# Patient Record
Sex: Male | Born: 1970 | Race: Black or African American | Hispanic: No | Marital: Married | Smoking: Former smoker
Health system: Southern US, Community
[De-identification: ages and names within clinical notes are randomized; demographics above are authoritative.]

## PROBLEM LIST (undated history)

## (undated) DIAGNOSIS — E119 Type 2 diabetes mellitus without complications: Secondary | ICD-10-CM

## (undated) DIAGNOSIS — I10 Essential (primary) hypertension: Secondary | ICD-10-CM

## (undated) HISTORY — PX: RETINAL DETACHMENT SURGERY: SHX105

## (undated) HISTORY — PX: EYE SURGERY: SHX253

---

## 2001-01-22 ENCOUNTER — Ambulatory Visit (HOSPITAL_COMMUNITY): Admission: RE | Admit: 2001-01-22 | Discharge: 2001-01-22 | Payer: Self-pay | Admitting: Internal Medicine

## 2001-01-22 ENCOUNTER — Encounter: Payer: Self-pay | Admitting: Internal Medicine

## 2014-11-02 ENCOUNTER — Encounter (HOSPITAL_COMMUNITY): Payer: Self-pay | Admitting: *Deleted

## 2014-11-02 ENCOUNTER — Emergency Department (HOSPITAL_COMMUNITY)
Admission: EM | Admit: 2014-11-02 | Discharge: 2014-11-02 | Disposition: A | Discharge status: Home / Self Care | Payer: PRIVATE HEALTH INSURANCE | Attending: Emergency Medicine | Admitting: Emergency Medicine

## 2014-11-02 DIAGNOSIS — S01412A Laceration without foreign body of left cheek and temporomandibular area, initial encounter: Secondary | ICD-10-CM | POA: Diagnosis not present

## 2014-11-02 DIAGNOSIS — Y9289 Other specified places as the place of occurrence of the external cause: Secondary | ICD-10-CM | POA: Diagnosis not present

## 2014-11-02 DIAGNOSIS — Z720 Tobacco use: Secondary | ICD-10-CM | POA: Diagnosis not present

## 2014-11-02 DIAGNOSIS — Y998 Other external cause status: Secondary | ICD-10-CM | POA: Diagnosis not present

## 2014-11-02 DIAGNOSIS — Z23 Encounter for immunization: Secondary | ICD-10-CM | POA: Diagnosis not present

## 2014-11-02 DIAGNOSIS — E119 Type 2 diabetes mellitus without complications: Secondary | ICD-10-CM | POA: Diagnosis not present

## 2014-11-02 DIAGNOSIS — Y9389 Activity, other specified: Secondary | ICD-10-CM | POA: Diagnosis not present

## 2014-11-02 DIAGNOSIS — S0181XA Laceration without foreign body of other part of head, initial encounter: Secondary | ICD-10-CM

## 2014-11-02 DIAGNOSIS — Y288XXA Contact with other sharp object, undetermined intent, initial encounter: Secondary | ICD-10-CM | POA: Insufficient documentation

## 2014-11-02 HISTORY — DX: Type 2 diabetes mellitus without complications: E11.9

## 2014-11-02 MED ORDER — TETANUS-DIPHTH-ACELL PERTUSSIS 5-2.5-18.5 LF-MCG/0.5 IM SUSP
0.5000 mL | Freq: Once | INTRAMUSCULAR | Status: AC
  Administered 2014-11-02: 0.5 mL via INTRAMUSCULAR
  Filled 2014-11-02: qty 0.5

## 2014-11-02 MED ORDER — LIDOCAINE HCL (PF) 1 % IJ SOLN
5.0000 mL | Freq: Once | INTRAMUSCULAR | Status: DC
Start: 2014-11-02 — End: 2014-11-03
  Filled 2014-11-02: qty 5

## 2014-11-02 NOTE — ED Provider Notes (Signed)
CSN: [id number]     Arrival date & time 11/02/14  1927 History   First MD Initiated Contact with Patient 11/02/14 2024     Chief Complaint  Patient presents with  . Facial Laceration     (Consider location/radiation/quality/duration/timing/severity/associated sxs/prior Treatment) HPI  George Romero is a 44 y.o. male who presents to the Emergency Department complaining of left facial laceration that occurred just prior to arrival.  States that he tripped and fell on his porch and a nail caused a laceration to his face.  Complains of mild swelling and bleeding.  He denies jaw pain, dental injury, neck pain or LOC.  Last Td is unknown.     Past Medical History  Diagnosis Date  . Diabetes mellitus without complication    History reviewed. No pertinent past surgical history. History reviewed. No pertinent family history. History  Substance Use Topics  . Smoking status: Current Every Day Smoker  . Smokeless tobacco: Not on file  . Alcohol Use: No    Review of Systems  Constitutional: Negative for fever and chills.  Musculoskeletal: Negative for back pain, joint swelling and arthralgias.  Skin: Positive for wound.       Laceration   Neurological: Negative for dizziness, weakness and numbness.  Hematological: Does not bruise/bleed easily.  All other systems reviewed and are negative.     Allergies  Review of patient's allergies indicates no known allergies.  Home Medications   Prior to Admission medications   Not on File   BP 140/84 mmHg  Pulse 99  Temp(Src) 98.9 F (37.2 C) (Oral)  Resp 20  Ht  (1.803 m)  Wt 239 lb (108.41 kg)  BMI 33.35 kg/m2  SpO2 99% Physical Exam  Constitutional: He is oriented to person, place, and time. He appears well-developed and well-nourished. No distress.  HENT:  Head: Normocephalic. Head is with laceration.    Right Ear: Tympanic membrane and ear canal normal.  Left Ear: Tympanic membrane and ear canal normal.  Mouth/Throat:  Uvula is midline, oropharynx is clear and moist and mucous membranes are normal. No trismus in the jaw.  2 cm laceration left face. Mild bleeding.    Neck: Normal range of motion. Neck supple.  Cardiovascular: Normal rate, regular rhythm, normal heart sounds and intact distal pulses.   No murmur heard. Pulmonary/Chest: Effort normal and breath sounds normal. No respiratory distress.  Musculoskeletal: He exhibits no edema or tenderness.  Lymphadenopathy:    He has no cervical adenopathy.  Neurological: He is alert and oriented to person, place, and time. He exhibits normal muscle tone. Coordination normal.  Skin: Skin is warm.  Nursing note and vitals reviewed.   ED Course  Procedures (including critical care time) Labs Review Labs Reviewed - No data to display  Imaging Review No results found.   EKG Interpretation None       LACERATION REPAIR Performed by: Sonu Kruckenberg L. Authorized by: Maxwell Caul Consent: Verbal consent obtained. Risks and benefits: risks, benefits and alternatives were discussed Consent given by: patient Patient identity confirmed: provided demographic data Prepped and Draped in normal sterile fashion Wound explored  Laceration Location: left face Laceration Length: 2 cm  No Foreign Bodies seen or palpated  Anesthesia: local infiltration  Local anesthetic: lidocaine 1% w/o epinephrine  Anesthetic total: 2 ml  Irrigation method: syringe Amount of cleaning: standard  Skin closure: 6-0 prolene  Number of sutures: 4  Technique: simple interrupted  Patient tolerance: Patient tolerated the procedure well with no immediate  complications.  MDM   Final diagnoses:  Facial laceration, initial encounter    Td updated.  Pt agrees to cold compresses, tylenol or ibuprofen if needed for pain.  Wound care instructions given, sutures out in 5-7 days.  Return here if needed.     Davis Ambrosini L. Rowe Robertriplett, PA-C 11/02/14 2112  Raeford RazorStephen Kohut,  MD 11/06/14 [phone]

## 2014-11-02 NOTE — Discharge Instructions (Signed)
Facial Laceration °A facial laceration is a cut on the face. These injuries can be painful and cause bleeding. Some cuts may need to be closed with stitches (sutures), skin adhesive strips, or wound glue. Cuts usually heal quickly but can leave a scar. It can take 1-2 years for the scar to go away completely. °HOME CARE  °· Only take medicines as told by your doctor. °· Follow your doctor's instructions for wound care. °For Stitches: °· Keep the cut clean and dry. °· If you have a bandage (dressing), change it at least once a day. Change the bandage if it gets wet or dirty, or as told by your doctor. °· Wash the cut with soap and water 2 times a day. Rinse the cut with water. Pat it dry with a clean towel. °· Put a thin layer of medicated cream on the cut as told by your doctor. °· You may shower after the first 24 hours. Do not soak the cut in water until the stitches are removed. °· Have your stitches removed as told by your doctor. °· Do not wear any makeup until a few days after your stitches are removed. °For Skin Adhesive Strips: °· Keep the cut clean and dry. °· Do not get the strips wet. You may take a bath, but be careful to keep the cut dry. °· If the cut gets wet, pat it dry with a clean towel. °· The strips will fall off on their own. Do not remove the strips that are still stuck to the cut. °For Wound Glue: °· You may shower or take baths. Do not soak or scrub the cut. Do not swim. Avoid heavy sweating until the glue falls off on its own. After a shower or bath, pat the cut dry with a clean towel. °· Do not put medicine or makeup on your cut until the glue falls off. °· If you have a bandage, do not put tape over the glue. °· Avoid lots of sunlight or tanning lamps until the glue falls off. °· The glue will fall off on its own in 5-10 days. Do not pick at the glue. °After Healing: °Put sunscreen on the cut for the first year to reduce your scar. °GET HELP RIGHT AWAY IF:  °· Your cut area gets red,  painful, or puffy (swollen). °· You see a yellowish-white fluid (pus) coming from the cut. °· You have chills or a fever. °MAKE SURE YOU:  °· Understand these instructions. °· Will watch your condition. °· Will get help right away if you are not doing well or get worse. °Document Released: 03/03/2008 Document Revised: 07/06/2013 Document Reviewed: 04/28/2013 °[employer]® Patient Information ©2015 [employer]. This information is not intended to replace advice given to you by your health care provider. Make sure you discuss any questions you have with your health care provider. ° °

## 2014-11-02 NOTE — ED Notes (Signed)
Lac to rt side of face, fell outside and struck a nail, puncture to lt mandibular area.

## 2014-11-02 NOTE — ED Notes (Signed)
Discharged home in good condition.  Patient verbalized understanding to have sutures removed in 5-7 days.

## 2014-11-11 ENCOUNTER — Emergency Department (HOSPITAL_COMMUNITY)
Admission: EM | Admit: 2014-11-11 | Discharge: 2014-11-11 | Disposition: A | Discharge status: Home / Self Care | Payer: PRIVATE HEALTH INSURANCE | Attending: Emergency Medicine | Admitting: Emergency Medicine

## 2014-11-11 ENCOUNTER — Encounter (HOSPITAL_COMMUNITY): Payer: Self-pay | Admitting: *Deleted

## 2014-11-11 DIAGNOSIS — E119 Type 2 diabetes mellitus without complications: Secondary | ICD-10-CM | POA: Diagnosis not present

## 2014-11-11 DIAGNOSIS — Z720 Tobacco use: Secondary | ICD-10-CM | POA: Diagnosis not present

## 2014-11-11 DIAGNOSIS — Z4802 Encounter for removal of sutures: Secondary | ICD-10-CM | POA: Diagnosis present

## 2014-11-11 NOTE — ED Notes (Addendum)
Sutures placed last Friday to chin area.

## 2014-11-11 NOTE — Discharge Instructions (Signed)

## 2014-11-11 NOTE — ED Provider Notes (Signed)
CSN: [id number]     Arrival date & time 11/11/14  0750 History   First MD Initiated Contact with Patient 11/11/14 0805     Chief Complaint  Patient presents with  . Suture / Staple Removal     (Consider location/radiation/quality/duration/timing/severity/associated sxs/prior Treatment) Patient is a 44 y.o. male presenting with suture removal. The history is provided by the patient.  Suture / Staple Removal  Jackelyn HoehnJames Jergens is a 44 y.o. male who presents to the ED for suture removal. He has 4 sutures to the left side of his face that were placed 9 days ago. He reports no problems.   Past Medical History  Diagnosis Date  . Diabetes mellitus without complication    History reviewed. No pertinent past surgical history. No family history on file. History  Substance Use Topics  . Smoking status: Current Every Day Smoker  . Smokeless tobacco: Not on file  . Alcohol Use: No    Review of Systems Negative except as stated in HPI   Allergies  Review of patient's allergies indicates no known allergies.  Home Medications   Prior to Admission medications   Not on File   BP 131/84 mmHg  Pulse 87  Temp(Src) 98.5 F (36.9 C) (Oral)  Resp 16  SpO2 100% Physical Exam  Constitutional: He is oriented to person, place, and time. He appears well-developed and well-nourished. No distress.  HENT:  Head:    4 sutures in place left side of face. Healing without signs of infection.   Eyes: EOM are normal.  Neck: Neck supple.  Cardiovascular: Normal rate.   Pulmonary/Chest: Effort normal.  Musculoskeletal: Normal range of motion.  Neurological: He is alert and oriented to person, place, and time. No cranial nerve deficit.  Skin: Skin is warm and dry.  Psychiatric: He has a normal mood and affect. His behavior is normal.  Nursing note and vitals reviewed.   ED Course  Procedures  Sutures removed by RN without problems.  MDM  44 y.o. male here for suture removal. Healing wound  without infection.  Final diagnoses:  Visit for suture removal      Janne NapoleonHope M Neese, NP 11/11/14 [id number]  Gerhard Munchobert Lockwood, MD 11/11/14 [phone]

## 2017-12-07 ENCOUNTER — Emergency Department (HOSPITAL_COMMUNITY)
Admission: EM | Admit: 2017-12-07 | Discharge: 2017-12-07 | Disposition: A | Discharge status: Home / Self Care | Payer: PRIVATE HEALTH INSURANCE | Attending: Emergency Medicine | Admitting: Emergency Medicine

## 2017-12-07 ENCOUNTER — Encounter (HOSPITAL_COMMUNITY): Payer: Self-pay

## 2017-12-07 ENCOUNTER — Other Ambulatory Visit: Payer: Self-pay

## 2017-12-07 ENCOUNTER — Emergency Department (HOSPITAL_COMMUNITY): Payer: PRIVATE HEALTH INSURANCE

## 2017-12-07 DIAGNOSIS — Y939 Activity, unspecified: Secondary | ICD-10-CM | POA: Insufficient documentation

## 2017-12-07 DIAGNOSIS — Y990 Civilian activity done for income or pay: Secondary | ICD-10-CM | POA: Insufficient documentation

## 2017-12-07 DIAGNOSIS — Y929 Unspecified place or not applicable: Secondary | ICD-10-CM | POA: Insufficient documentation

## 2017-12-07 DIAGNOSIS — F17210 Nicotine dependence, cigarettes, uncomplicated: Secondary | ICD-10-CM | POA: Insufficient documentation

## 2017-12-07 DIAGNOSIS — W231XXA Caught, crushed, jammed, or pinched between stationary objects, initial encounter: Secondary | ICD-10-CM | POA: Insufficient documentation

## 2017-12-07 DIAGNOSIS — E119 Type 2 diabetes mellitus without complications: Secondary | ICD-10-CM | POA: Insufficient documentation

## 2017-12-07 DIAGNOSIS — S61411A Laceration without foreign body of right hand, initial encounter: Secondary | ICD-10-CM | POA: Insufficient documentation

## 2017-12-07 MED ORDER — HYDROCODONE-ACETAMINOPHEN 5-325 MG PO TABS
2.0000 | ORAL_TABLET | Freq: Once | ORAL | Status: AC
  Administered 2017-12-07: 2 via ORAL
  Filled 2017-12-07: qty 2

## 2017-12-07 MED ORDER — LIDOCAINE HCL (PF) 1 % IJ SOLN: 30.0000 mL | Freq: Once | INTRAMUSCULAR | Status: DC

## 2017-12-07 MED ORDER — OXYCODONE-ACETAMINOPHEN 5-325 MG PO TABS: 1.0000 | ORAL_TABLET | Freq: Three times a day (TID) | ORAL | 0 refills | Status: DC | PRN

## 2017-12-07 MED ORDER — LIDOCAINE HCL (PF) 1 % IJ SOLN
INTRAMUSCULAR | Status: AC
  Filled 2017-12-07: qty 2

## 2017-12-07 MED ORDER — BACITRACIN ZINC 500 UNIT/GM EX OINT
TOPICAL_OINTMENT | CUTANEOUS | Status: AC
  Filled 2017-12-07: qty 0.9

## 2017-12-07 NOTE — ED Provider Notes (Signed)
Emergency Department Provider Note   I have reviewed the triage vital signs and the nursing notes.   HISTORY  Chief Complaint Hand Injury   HPI George Romero is a 47 y.o. male with history of diabetes who presents secondary to hand injury.  Patient is a George Romero and George Romero stand failed and a car fell pinning his hand between the car and the ground.  He was jacked up and his hand was removed and he sustained a laceration across the palm of his right hand initially had a little bit of numbness and difficulty moving his pointer finger which has since resolved.  Pain was controlled with ibuprofen prior to my evaluation while waiting in the emergency department.  Last tetanus shot was about a year ago. No other associated or modifying symptoms.    Past Medical History:  Diagnosis Date  . Diabetes mellitus without complication (HCC)     There are no active problems to display for this patient.   History reviewed. No pertinent surgical history.    Allergies Patient has no known allergies.  History reviewed. No pertinent family history.  Social History Social History   Tobacco Use  . Smoking status: Current Every Day Smoker    Packs/day: 0.50    Types: Cigarettes  . Smokeless tobacco: Never Used  Substance Use Topics  . Alcohol use: No  . Drug use: No    Review of Systems  All other systems negative except as documented in the HPI. All pertinent positives and negatives as reviewed in the HPI. ____________________________________________   PHYSICAL EXAM:  VITAL SIGNS: ED Triage Vitals  Enc Vitals Group     BP 12/07/17 1130 (!) 134/94     Pulse Rate 12/07/17 1130 78     Resp 12/07/17 1130 18     Temp 12/07/17 1130 98.6 F (37 C)     Temp Source 12/07/17 1130 Oral     SpO2 12/07/17 1130 100 %     Weight 12/07/17 1131 240 lb (108.9 kg)     Height 12/07/17 1131 5\' 11"  (1.803 m)     Head Circumference --      Peak Flow --      Pain Score 12/07/17 1131 8   Pain Loc --      Pain Edu? --      Excl. in GC? --     Constitutional: Alert and oriented. Well appearing and in no acute distress. Eyes: Conjunctivae are normal. PERRL. EOMI. Head: Atraumatic. Nose: No congestion/rhinnorhea. Mouth/Throat: Mucous membranes are moist.  Oropharynx non-erythematous. Neck: No stridor.  No meningeal signs.   Cardiovascular: Normal rate, regular rhythm. Good peripheral circulation. Grossly normal heart sounds.   Respiratory: Normal respiratory effort.  No retractions. Lungs CTAB. Gastrointestinal: Soft and nontender. No distention.  Musculoskeletal: No lower extremity tenderness nor edema. No gross deformities of extremities. Neurologic:  Normal speech and language. No gross focal neurologic deficits are appreciated.  Skin:  Skin is warm, dry and laceration over volar surface of mid hand. Full ROM of fingers. Normal sensation to distal fingers. No rash noted.   ____________________________________________    RADIOLOGY  Dg Hand Complete Right  Result Date: 12/07/2017 CLINICAL DATA:  Crush injury by car George Romero. EXAM: RIGHT HAND - COMPLETE 3+ VIEW COMPARISON:  None. FINDINGS: There is no evidence of fracture or dislocation. There is no evidence of arthropathy or other focal bone abnormality. No sign of radiopaque foreign object. IMPRESSION: Negative. Electronically Signed   By: Paulina FusiMark  Shogry  M.D.   On: 12/07/2017 11:59    ____________________________________________   PROCEDURES  Procedure(s) performed:   Marland KitchenMarland KitchenLaceration Repair Date/Time: 12/07/2017 6:01 PM Performed by: Marily Memos, MD Authorized by: Marily Memos, MD   Consent:    Consent obtained:  Verbal   Consent given by:  Patient   Risks discussed:  Infection, pain, poor cosmetic result, poor wound healing, vascular damage, tendon damage and nerve damage   Alternatives discussed:  No treatment Anesthesia (see MAR for exact dosages):    Anesthesia method:  Local infiltration   Local  anesthetic:  Lidocaine 1% w/o epi Laceration details:    Location:  Hand   Hand location:  R palm   Length (cm):  7   Depth (mm):  3 Repair type:    Repair type:  Complex Pre-procedure details:    Preparation:  Patient was prepped and draped in usual sterile fashion Exploration:    Limited defect created (wound extended): no     Hemostasis achieved with:  Direct pressure   Wound exploration: wound explored through full range of motion and entire depth of wound probed and visualized     Wound extent: areolar tissue violated, fascia violated and nerve damage (possibly as he had intermittent numbness to whole pointer finger)     Wound extent: no foreign bodies/material noted, no muscle damage noted, no tendon damage noted, no underlying fracture noted and no vascular damage noted     Contaminated: no   Treatment:    Area cleansed with:  Betadine and saline   Amount of cleaning:  Standard   Irrigation solution:  Sterile saline   Irrigation volume:  500   Irrigation method:  Syringe   Visualized foreign bodies/material removed: no     Debridement:  Moderate   Undermining:  None Skin repair:    Repair method:  Sutures   Suture size:  3-0   Suture material:  Prolene   Suture technique:  Simple interrupted   Number of sutures:  4 Approximation:    Approximation:  Loose Post-procedure details:    Dressing:  Antibiotic ointment, sterile dressing and splint for protection   Patient tolerance of procedure:  Procedure terminated at patient's request Comments:     Patient was not able to be numbed up completely after multiple re-dosing of lidocaine.  Patient asked if there was an alternative and I stated that the wound likely was not going to close perfectly anyway so we decided to put in for sutures loosely and will splint it for the next week until you come back to get the sutures removed and he is okay with having a scar in that area and will come back also for any evidence of  infection.     ____________________________________________   INITIAL IMPRESSION / ASSESSMENT AND PLAN / ED COURSE  TDAP UTD. Laceration will be repaired. Neuro exam normal. Will explore wound fully after lidocaine before repair.   Patient was not able to be numbed up completely after multiple re-dosing of lidocaine.  Patient asked if there was an alternative and I stated that the wound likely was not going to close perfectly anyway so we decided to put in for sutures loosely and will splint it for the next week until he can come back to get the sutures removed and he is okay with having a scar in that area and will come back also for any evidence of infection.  He did have numbness in his right pointer finger after lidocaine was instilled.  This was not present prior to that.  I suspect is related to the lidocaine rather than a nerve injury however it does not improve in 1-2 days he will follow-up with hand surgery.   Pertinent labs & imaging results that were available during my care of the patient were reviewed by me and considered in my medical decision making (see chart for details).  ____________________________________________  FINAL CLINICAL IMPRESSION(S) / ED DIAGNOSES  Final diagnoses:  Laceration of right hand, foreign body presence unspecified, initial encounter     MEDICATIONS GIVEN DURING THIS VISIT:  Medications  lidocaine (PF) (XYLOCAINE) 1 % injection 30 mL (not administered)  lidocaine (PF) (XYLOCAINE) 1 % injection (not administered)  bacitracin 500 UNIT/GM ointment (not administered)  HYDROcodone-acetaminophen (NORCO/VICODIN) 5-325 MG per tablet 2 tablet (2 tablets Oral Given 12/07/17 1612)     NEW OUTPATIENT MEDICATIONS STARTED DURING THIS VISIT:  New Prescriptions   OXYCODONE-ACETAMINOPHEN (PERCOCET) 5-325 MG TABLET    Take 1-2 tablets by mouth every 8 (eight) hours as needed for severe pain.    Note:  This note was prepared with assistance of Dragon  voice recognition software. Occasional wrong-word or sound-a-like substitutions may have occurred due to the inherent limitations of voice recognition software.   Marily Memos, MD 12/07/17 1806

## 2017-12-07 NOTE — ED Notes (Signed)
EDP at bedside  

## 2017-12-07 NOTE — ED Triage Notes (Signed)
PT states a car jack stand slipped and landed onto his left hand. PT c/o decreased feeling and movement to index finger and laceration with bleeding controlled noted to palm of hand.

## 2017-12-17 ENCOUNTER — Other Ambulatory Visit: Payer: Self-pay

## 2017-12-17 ENCOUNTER — Encounter (HOSPITAL_COMMUNITY): Payer: Self-pay | Admitting: Emergency Medicine

## 2017-12-17 ENCOUNTER — Emergency Department (HOSPITAL_COMMUNITY)
Admission: EM | Admit: 2017-12-17 | Discharge: 2017-12-17 | Disposition: A | Discharge status: Home / Self Care | Payer: PRIVATE HEALTH INSURANCE | Attending: Emergency Medicine | Admitting: Emergency Medicine

## 2017-12-17 DIAGNOSIS — Z4802 Encounter for removal of sutures: Secondary | ICD-10-CM | POA: Insufficient documentation

## 2017-12-17 HISTORY — DX: Essential (primary) hypertension: I10

## 2017-12-17 NOTE — ED Provider Notes (Signed)
[facility] EMERGENCY DEPARTMENT Provider Note   CSN: [id number] Arrival date & time: 12/17/17  0708     History   Chief Complaint Chief Complaint  Patient presents with  . Suture / Staple Removal    HPI Julie Paolini is a 47 y.o. male.  Chief complaint is suture removal  HPI 47 year old male.  Lacerated the palm of his right hand when a ball joint on a car fell on his hand 10 days ago.  The wound was x-rayed, explored, and repaired.  He presents for routine suture removal.  He is wearing a dorsal extension blocking splint  Past Medical History:  Diagnosis Date  . Diabetes mellitus without complication (HCC)   . Hypertension     There are no active problems to display for this patient.   History reviewed. No pertinent surgical history.     Home Medications    Prior to Admission medications   Medication Sig Start Date End Date Taking? Authorizing Provider  oxyCODONE-acetaminophen (PERCOCET) 5-325 MG tablet Take 1-2 tablets by mouth every 8 (eight) hours as needed for severe pain. 12/07/17   Mesner, Barbara Cower, MD    Family History History reviewed. No pertinent family history.  Social History Social History   Tobacco Use  . Smoking status: Current Every Day Smoker    Packs/day: 0.50    Types: Cigarettes  . Smokeless tobacco: Never Used  Substance Use Topics  . Alcohol use: No  . Drug use: No     Allergies   Patient has no known allergies.   Review of Systems Review of Systems  Skin:       He has been examining the wound every other day.  It is not draining.  He states it is "still kinda open".     Physical Exam Updated Vital Signs BP (!) 143/100 (BP Location: Left Arm)   Pulse 82   Resp 18   Ht 5\' 11"  (1.803 m)   Wt 112.9 kg (249 lb)   SpO2 100%   BMI 34.73 kg/m   Physical Exam  Exam of the right hand shows an oblique laceration across the palm of the hand and extending into the thenar eminence.  Tendon function distally to FDS, FDP tendon  function is intact.  Sensation is intact.  Capillary refill is intact.  There are 4 sutures approximating this in centimeter wound.  These are removed dehiscence of the wound before and after suture removal.  I discussed with him secondary healing and granulation.  Wound was cleaned.  Covered with Vaseline gauze, dry gauze, and Ace wrap.  The dorsal blocking splint was reapplied at his request.  He states that it keeps him from "bumping it work". ED Treatments / Results  Labs (all labs ordered are listed, but only abnormal results are displayed) Labs Reviewed - No data to display  EKG  EKG Interpretation None       Radiology No results found.  Procedures Procedures (including critical care time)  Medications Ordered in ED Medications - No data to display   Initial Impression / Assessment and Plan / ED Course  I have reviewed the triage vital signs and the nursing notes.  Pertinent labs & imaging results that were available during my care of the patient were reviewed by me and considered in my medical decision making (see chart for details).    Daily removal of gauze dressing for soap and water washing.  Reapplying ointment.  Primary care follow-up.  Final Clinical Impressions(s) /  ED Diagnoses   Final diagnoses:  Visit for suture removal    ED Discharge Orders    None       Rolland PorterJames, Najai Waszak, MD 12/17/17 [phone]

## 2017-12-17 NOTE — Discharge Instructions (Addendum)
Wash wound daily. Re-apply gauze and ace.

## 2017-12-17 NOTE — ED Triage Notes (Signed)
Pt wa seen previously for laceration.   Stitches placed in right hand.  Pt here to have stitches removed.

## 2018-07-02 ENCOUNTER — Encounter: Payer: Self-pay | Admitting: Urology

## 2018-07-02 ENCOUNTER — Ambulatory Visit (INDEPENDENT_AMBULATORY_CARE_PROVIDER_SITE_OTHER): Payer: No Typology Code available for payment source | Admitting: Urology

## 2018-07-02 VITALS — BP 156/94 | HR 73 | Ht 71.0 in | Wt 256.8 lb

## 2018-07-02 DIAGNOSIS — E291 Testicular hypofunction: Secondary | ICD-10-CM

## 2018-07-02 NOTE — Progress Notes (Signed)
07/02/2018 2:20 PM   Jackelyn Hoehn Sep 20, 1971 [id number]  Referring provider: Avon Gully, MD [address] [place], [place] [address]  Chief Complaint  Patient presents with  . Hypogonadism    HPI: 47 year old male seen in consultation at the request of Dr. Felecia Shelling for hypogonadism.  He has a prior history of hypogonadism and states 4-5 years ago he initially started a testosterone gel and was switched to weekly injections when his levels were not adequate.  He took replacement for 2 to 3 years then stopped.  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  He complains of tiredness, fatigue and decreased libido.  He has mild erectile dysfunction.  A testosterone level checked by his PCP on 05/19/2018 was 15 ng/dL.  His PSA was 0.1.   PMH: Past Medical History:  Diagnosis Date  . Diabetes mellitus without complication (HCC)   . Hypertension     Surgical History: No past surgical history on file.  Home Medications:  Allergies as of 07/02/2018   No Known Allergies     Medication List        Accurate as of 07/02/18  2:20 PM. Always use your most recent med list.          LANTUS 100 UNIT/ML injection Generic drug:  insulin glargine INJECT 50 UNITS INTO THE SKIN QHS   lisinopril 10 MG tablet Commonly known as:  PRINIVIL,ZESTRIL TK 1 T PO QD   metFORMIN 1000 MG tablet Commonly known as:  GLUCOPHAGE TK 1 T PO  BID       Allergies: No Known Allergies  Family History: Family History  Problem Relation Age of Onset  . Cancer Mother     Social History:  reports that he has been smoking cigarettes. He has a 7.50 pack-year smoking history. He has never used smokeless tobacco. He reports that he does not drink alcohol or use drugs.  ROS: UROLOGY Frequent Urination?: No Hard to postpone urination?: No Burning/pain with urination?: No Get up at night to urinate?: No Leakage of urine?: No Urine stream starts and stops?:  No Trouble starting stream?: No Do you have to strain to urinate?: No Blood in urine?: No Urinary tract infection?: No Sexually transmitted disease?: No Injury to kidneys or bladder?: No Painful intercourse?: No Weak stream?: No Erection problems?: No Penile pain?: No  Gastrointestinal Nausea?: No Vomiting?: No Indigestion/heartburn?: No Diarrhea?: No Constipation?: No  Constitutional Fever: No Night sweats?: No Weight loss?: No Fatigue?: No  Skin Skin rash/lesions?: No Itching?: No  Eyes Blurred vision?: No Double vision?: No  Ears/Nose/Throat Sore throat?: No Sinus problems?: No  Hematologic/Lymphatic Swollen glands?: No Easy bruising?: No  Cardiovascular Leg swelling?: No Chest pain?: No  Respiratory Cough?: No Shortness of breath?: No  Endocrine Excessive thirst?: No  Musculoskeletal Back pain?: No Joint pain?: No  Neurological Headaches?: No Dizziness?: No  Psychologic Depression?: No Anxiety?: No  Physical Exam: BP (!) 156/94 (BP Location: Left Arm, Patient Position: Sitting, Cuff Size: Large)   Pulse 73   Ht 5\' 11"  (1.803 m)   Wt 256 lb 12.8 oz (116.5 kg)   BMI 35.82 kg/m   Constitutional:  Alert and oriented, No acute distress. HEENT:  AT, moist mucus membranes.  Trachea midline, no masses. Cardiovascular: No clubbing, cyanosis, or edema. Respiratory: Normal respiratory effort, no increased work of breathing. GI: Abdomen is soft, nontender, nondistended, no abdominal masses GU: No CVA tenderness.  Penis is without lesions, left testis surgically  absent.  The right testis is atrophic. Lymph: No cervical or inguinal lymphadenopathy. Skin: No rashes, bruises or suspicious lesions. Neurologic: Grossly intact, no focal deficits, moving all 4 extremities. Psychiatric: Normal mood and affect.   Assessment & Plan:   47 year old male with testosterone level in the castrate range.  Will check an LH and prolactin in addition to a  repeat testosterone.  He previously did not get good levels on topical gels.  Additional options were discussed including injections, Testopel, Natesto and Xyosted.  Potential side effects of testosterone replacement were discussed including stimulation of benign prostatic growth with lower urinary tract symptoms; erythrocytosis; edema; gynecomastia; worsening sleep apnea; venous thromboembolism; testicular atrophy and infertility. Recent studies suggesting an increased incidence of heart attack and stroke in patients taking testosterone was discussed. He was informed there is conflicting evidence regarding the impact of testosterone therapy on cardiovascular risk. The theoretical risk of growth stimulation of an undetected prostate cancer was also discussed.  He was informed that current evidence does not provide any definitive answers regarding the risks of testosterone therapy on prostate cancer and cardiovascular disease. The need for periodic monitoring of his testosterone level, PSA, hematocrit and DRE was discussed.  He will be contacted with his lab work and will think over his preferred method of delivery.   Riki Altes, MD  [facility] [address] [place], [place] [address] [phone]

## 2018-07-03 LAB — TESTOSTERONE: TESTOSTERONE: 13 ng/dL — AB (ref 264–916)

## 2018-07-03 LAB — LUTEINIZING HORMONE: LH: 8.4 m[IU]/mL (ref 1.7–8.6)

## 2018-07-03 LAB — PROLACTIN: PROLACTIN: 3.4 ng/mL — AB (ref 4.0–15.2)

## 2018-07-03 LAB — PSA: Prostate Specific Ag, Serum: <0.1 ng/mL (ref 0.0–4.0)

## 2018-07-11 ENCOUNTER — Other Ambulatory Visit: Payer: Self-pay | Admitting: Urology

## 2018-07-11 DIAGNOSIS — E291 Testicular hypofunction: Secondary | ICD-10-CM

## 2018-07-13 ENCOUNTER — Telehealth: Payer: Self-pay | Admitting: Family Medicine

## 2018-07-13 NOTE — Telephone Encounter (Signed)
-----   Message from Riki Altes, MD sent at 07/11/2018 10:18 AM EDT ----- Testosterone level extremely low at 13 and his LH is not appropriately elevated.  Recommend a pituitary MRI.  Order was entered.  Will call with results.

## 2018-07-13 NOTE — Telephone Encounter (Signed)
Patient notified and voiced understanding.

## 2018-08-03 ENCOUNTER — Encounter (HOSPITAL_COMMUNITY): Payer: Self-pay

## 2018-08-03 ENCOUNTER — Ambulatory Visit (HOSPITAL_COMMUNITY)
Admission: RE | Admit: 2018-08-03 | Discharge: 2018-08-03 | Disposition: A | Discharge status: Home / Self Care | Payer: No Typology Code available for payment source | Source: Ambulatory Visit | Attending: Urology | Admitting: Urology

## 2018-08-03 DIAGNOSIS — E291 Testicular hypofunction: Secondary | ICD-10-CM

## 2018-08-16 ENCOUNTER — Telehealth: Payer: Self-pay | Admitting: Urology

## 2018-08-16 NOTE — Telephone Encounter (Signed)
-----   Message from Riki AltesScott C Stoioff, MD sent at 08/13/2018  7:20 AM EST ----- I saw this patient 1 month ago and recommended a pituitary MRI.  [place] lives in [place] and wanted it scheduled there.  It does not look like it is yet been scheduled.  Can you please check on status?

## 2018-08-16 NOTE — Telephone Encounter (Signed)
I called the patient and had to leave a voicemail to call back to find out why he never got his MRI. His PA expires on the 25th so if he is going to get it ne needs to do it before then.    George DusterMichelle

## 2018-08-17 NOTE — Telephone Encounter (Signed)
He definitely needs to have the MRI done because he may have a pituitary tumor.  Can see if an open MRI can be scheduled.  The [facility].

## 2018-09-09 NOTE — Telephone Encounter (Signed)
He still doesn't want to do it and said [facility] is to far away. He is coming in on Tuesday to talk to you.   George DusterMichelle

## 2018-09-14 ENCOUNTER — Encounter: Payer: Self-pay | Admitting: Urology

## 2018-09-14 ENCOUNTER — Ambulatory Visit (INDEPENDENT_AMBULATORY_CARE_PROVIDER_SITE_OTHER): Payer: No Typology Code available for payment source | Admitting: Urology

## 2018-09-14 VITALS — BP 150/97 | HR 80 | Ht 71.0 in | Wt 260.0 lb

## 2018-09-14 DIAGNOSIS — E291 Testicular hypofunction: Secondary | ICD-10-CM

## 2018-09-14 NOTE — Progress Notes (Signed)
09/14/2018 3:15 PM   Jackelyn HoehnJames Solivan 04-27-1971 [id number]  Referring provider: Avon GullyFanta, Tesfaye, MD [address] [place], [place] [id number]  Chief Complaint  Patient presents with  . Hypogonadism    discuss options    HPI: 47 year old male presents for follow-up of hypogonadism.  He had a castrate testosterone level and an LH that was not appropriately elevated.  I had recommended a pituitary MRI and he declined to have it done.  He made a follow-up appointment today.  No previous records are available.   PMH: Past Medical History:  Diagnosis Date  . Diabetes mellitus without complication (HCC)   . Hypertension     Surgical History: No past surgical history on file.  Home Medications:  Allergies as of 09/14/2018   No Known Allergies     Medication List       Accurate as of September 14, 2018  3:15 PM. Always use your most recent med list.        LANTUS 100 UNIT/ML injection Generic drug:  insulin glargine INJECT 50 UNITS INTO THE SKIN QHS   lisinopril 10 MG tablet Commonly known as:  PRINIVIL,ZESTRIL TK 1 T PO QD   metFORMIN 1000 MG tablet Commonly known as:  GLUCOPHAGE TK 1 T PO  BID       Allergies: No Known Allergies  Family History: Family History  Problem Relation Age of Onset  . Cancer Mother     Social History:  reports that he has been smoking cigarettes. He has a 7.50 pack-year smoking history. He has never used smokeless tobacco. He reports that he does not drink alcohol or use drugs.  ROS: UROLOGY Frequent Urination?: No Hard to postpone urination?: No Burning/pain with urination?: No Get up at night to urinate?: No Leakage of urine?: No Urine stream starts and stops?: No Trouble starting stream?: No Do you have to strain to urinate?: No Blood in urine?: No Urinary tract infection?: No Sexually transmitted disease?: No Injury to kidneys or bladder?: No Painful intercourse?: No Weak stream?: No Erection problems?:  No Penile pain?: No  Gastrointestinal Nausea?: No Vomiting?: No Indigestion/heartburn?: No Diarrhea?: No Constipation?: No  Constitutional Fever: No Night sweats?: No Weight loss?: No Fatigue?: No  Skin Skin rash/lesions?: No Itching?: No  Eyes Blurred vision?: No Double vision?: No  Ears/Nose/Throat Sore throat?: No Sinus problems?: No  Hematologic/Lymphatic Swollen glands?: No Easy bruising?: No  Cardiovascular Leg swelling?: No Chest pain?: No  Respiratory Cough?: No Shortness of breath?: No  Endocrine Excessive thirst?: No  Musculoskeletal Back pain?: No Joint pain?: No  Neurological Headaches?: No Dizziness?: No  Psychologic Depression?: No Anxiety?: No  Physical Exam: BP (!) 150/97 (BP Location: Left Arm, Patient Position: Sitting, Cuff Size: Large)   Pulse 80   Ht 5\' 11"  (1.803 m)   Wt 260 lb (117.9 kg)   BMI 36.26 kg/m   Constitutional:  Alert and oriented, No acute distress. HEENT: [place] AT, moist mucus membranes.  Trachea midline, no masses. Cardiovascular: No clubbing, cyanosis, or edema. Respiratory: Normal respiratory effort, no increased work of breathing. GI: Abdomen is soft, nontender, nondistended, no abdominal masses GU: No CVA tenderness Lymph: No cervical or inguinal lymphadenopathy. Skin: No rashes, bruises or suspicious lesions. Neurologic: Grossly intact, no focal deficits, moving all 4 extremities. Psychiatric: Normal mood and affect.   Assessment & Plan:   47 year old male with castrate testosterone level and nml LH.  Discussed possibility of a pituitary tumor.  He has declined MRI.  I  informed him I did not feel comfortable treating his hypogonadism without pituitary imaging.  I offered him an endocrinology appointment for second opinion which he has elected to pursue.   Riki Altes, MD  [facility] [address], [address] [place], [place] [address] [phone]

## 2018-09-15 ENCOUNTER — Encounter: Payer: Self-pay | Admitting: Urology

## 2018-11-15 ENCOUNTER — Encounter: Payer: Self-pay | Admitting: "Endocrinology

## 2018-11-15 ENCOUNTER — Ambulatory Visit (INDEPENDENT_AMBULATORY_CARE_PROVIDER_SITE_OTHER): Payer: No Typology Code available for payment source | Admitting: "Endocrinology

## 2018-11-15 VITALS — BP 138/84 | HR 74 | Ht 71.0 in | Wt 268.0 lb

## 2018-11-15 DIAGNOSIS — E291 Testicular hypofunction: Secondary | ICD-10-CM | POA: Diagnosis not present

## 2018-11-15 NOTE — Progress Notes (Signed)
Endocrinology Consult Note   George Romero, 48 y.o., male   Chief Complaint  Patient presents with  . Establish Care    hypogonadism     Past Medical History:  Diagnosis Date  . Diabetes mellitus without complication (HCC)   . Hypertension    History reviewed. No pertinent surgical history. Social History   Socioeconomic History  . Marital status: Married    Spouse name: Not on file  . Number of children: Not on file  . Years of education: Not on file  . Highest education level: Not on file  Occupational History  . Not on file  Social Needs  . Financial resource strain: Not on file  . Food insecurity:    Worry: Not on file    Inability: Not on file  . Transportation needs:    Medical: Not on file    Non-medical: Not on file  Tobacco Use  . Smoking status: Current Every Day Smoker    Packs/day: 0.50    Years: 15.00    Pack years: 7.50    Types: Cigarettes  . Smokeless tobacco: Never Used  Substance and Sexual Activity  . Alcohol use: No  . Drug use: No  . Sexual activity: Yes    Birth control/protection: None  Lifestyle  . Physical activity:    Days per week: Not on file    Minutes per session: Not on file  . Stress: Not on file  Relationships  . Social connections:    Talks on phone: Not on file    Gets together: Not on file    Attends religious service: Not on file    Active member of club or organization: Not on file    Attends meetings of clubs or organizations: Not on file    Relationship status: Not on file  Other Topics Concern  . Not on file  Social History Narrative  . Not on file   Outpatient Encounter Medications as of 11/15/2018  Medication Sig  . LANTUS 100 UNIT/ML injection INJECT 50 UNITS INTO THE SKIN QHS  . lisinopril (PRINIVIL,ZESTRIL) 10 MG tablet TK 1 T PO QD  . metFORMIN (GLUCOPHAGE) 1000 MG tablet TK 1 T PO  BID   No facility-administered encounter medications on file as of  11/15/2018.    ALLERGIES: No Known Allergies  VACCINATION STATUS: Immunization History  Administered Date(s) Administered  . Tdap 11/02/2014    HPI: George Romero is a 48 y.o.-year-old man, being seen in consultation for evaluation and management of low testosterone requested by by his  Avon Gully, MD.  -Patient with long and complicated medical history.  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 -He was previously treated with various forms of testosterone replacement including topical and injectable forms.  His last exposure to testosterone was more than 4 years ago with Dr. Johny Chess. -He admits to decreased libido, has difficulty obtaining and maintaining erection.  -He denies trauma to the testicles, chemotherapy, and radiation.  He grew and went through puberty like his peers, has never achieved fertility. -He wishes to be treated with testosterone again.  No vision problems.  No report of changing headaches.  No family history of hypogonadism/infertility.  No family history of hemochromatosis or pituitary tumors.  ROS: Constitutional: + Perceived weight gain,  +  fatigue, no subjective hyperthermia/hypothermia Eyes:  no blurry vision, no xerophthalmia ENT: no sore throat, no nodules palpated in throat, no dysphagia/odynophagia, no hoarseness Cardiovascular: no CP/SOB/palpitations/leg swelling Respiratory: no cough, no shortness of breath. Gastrointestinal: no nausea, vomiting, diarrhea, nor constipation Musculoskeletal: no muscle/joint aches Skin: no rashes Neurological: no tremors/numbness/tingling/dizziness Psychiatric: no depression/anxiety  PE: BP 138/84   Pulse 74   Ht 5\' 11"  (1.803 m)   Wt 268 lb (121.6 kg)   BMI 37.38 kg/m  Wt Readings from Last 3 Encounters:  11/15/18 268 lb (121.6 kg)  09/14/18 260 lb (117.9  kg)  07/02/18 256 lb 12.8 oz (116.5 kg)   Constitutional:  + obese, in NAD Eyes: PERRLA, EOMI, no exophthalmos ENT: moist mucous membranes, no thyromegaly, no cervical lymphadenopathy Cardiovascular: RRR, No MRG Respiratory: CTA B Gastrointestinal: abdomen soft, NT, ND, BS+ Musculoskeletal: no deformities, strength intact in all 4 Skin: moist, warm, no rashes Neurological: no tremor with outstretched hands, DTR normal in all 4 Genital exam: + Evidence of previous inguinal surgery, right testicle soft and less than 10 cc, 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  No other intrascrotal mass lesions.  No inguinal lymphadenopathy, normal phallus, no penile discharge.  + gynecomastia.  Results for George Romero, George Romero (MRN [id number]) as of 11/15/2018 10:36  Ref. Range 07/02/2018 14:14  LH Latest Ref Range: 1.7 - 8.6 mIU/mL 8.4  Prolactin Latest Ref Range: 4.0 - 15.2 ng/mL 3.4 (L)  Testosterone Latest Ref Range: 264 - 916 ng/dL 13 (L)  Prostate Specific Ag, Serum Latest Ref Range: 0.0 - 4.0 ng/mL <0.1    ASSESSMENT: 1. 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  Surgical treatment was attempted when he was 40 or 48 years old.  He only has soft and shrunk right testicle-less than 10 cc. - I have discussed potential causes of hypogonadism, diagnosis of hypogonadism,  and relevant workup to confirm the diagnosis of hypogonadism before initiating testosterone replacement therapy.  -Patient is unlikely to achieve any fertility, and is aware of the fact that testosterone replacement will not help him regain fertility.  -This patient will require and benefit from testosterone treatment. -I will proceed to repeat his labs this morning on a fasting sample,  and patient returns in 1 week for treatment initiation. -I had a long discussion with him about the need for regular follow-up with lab work at least every 3-53-month in order to achieve optimal testosterone replacement therapy.  -Given his history of type 2 diabetes, he will benefit the most from weight loss.  We briefly discussed about controlling his diet, improving his sleep, and exercise regularly.   -He may benefit from phosphodiesterase inhibitors for erectile dysfunction.  -No prescription was initiated today.   - Time spent with the patient: 35 minutes, of which >50% was spent in obtaining information about his symptoms, reviewing his previous labs, evaluations, and treatments, counseling him about his chronic primary hypogonadism, and developing a plan to confirm the diagnosis and long term treatment as necessary.  George Romero participated in the discussions, expressed understanding, and voiced agreement with the above plans.  All questions were answered to his satisfaction. he is encouraged to contact clinic should he have any questions or concerns prior to his return visit.  Return in about 1 week (around  11/22/2018) for Labs Today- Non-Fasting Ok.  Marquis LunchGebre Zissy Hamlett, MD [facility] [address] [place], [place] [address] Phone: [phone]  Fax: [phone]   11/15/2018, 10:31 AM  This note was partially dictated with voice recognition software. Similar sounding words can be transcribed inadequately or may not  be corrected upon review.

## 2018-11-18 LAB — TESTOS,TOTAL,FREE AND SHBG (FEMALE)
FREE TESTOSTERONE: 1.8 pg/mL — AB (ref 35.0–155.0)
SEX HORMONE BINDING: 38 nmol/L (ref 10–50)
TESTOSTERONE, TOTAL, LC-MS-MS: 14 ng/dL — AB (ref 250–1100)

## 2018-11-18 LAB — CBC WITH DIFFERENTIAL/PLATELET
Absolute Monocytes: 382 cells/uL (ref 200–950)
BASOS ABS: 109 {cells}/uL (ref 0–200)
Basophils Relative: 1.4 %
EOS PCT: 4.1 %
Eosinophils Absolute: 320 cells/uL (ref 15–500)
HCT: 38.3 % — ABNORMAL LOW (ref 38.5–50.0)
Hemoglobin: 12.6 g/dL — ABNORMAL LOW (ref 13.2–17.1)
Lymphs Abs: 2824 cells/uL (ref 850–3900)
MCH: 27.3 pg (ref 27.0–33.0)
MCHC: 32.9 g/dL (ref 32.0–36.0)
MCV: 83.1 fL (ref 80.0–100.0)
MPV: 11 fL (ref 7.5–12.5)
Monocytes Relative: 4.9 %
NEUTROS PCT: 53.4 %
Neutro Abs: 4165 cells/uL (ref 1500–7800)
PLATELETS: 308 10*3/uL (ref 140–400)
RBC: 4.61 10*6/uL (ref 4.20–5.80)
RDW: 12.7 % (ref 11.0–15.0)
TOTAL LYMPHOCYTE: 36.2 %
WBC: 7.8 10*3/uL (ref 3.8–10.8)

## 2018-11-23 ENCOUNTER — Encounter: Payer: Self-pay | Admitting: "Endocrinology

## 2018-11-23 ENCOUNTER — Ambulatory Visit (INDEPENDENT_AMBULATORY_CARE_PROVIDER_SITE_OTHER): Payer: No Typology Code available for payment source | Admitting: "Endocrinology

## 2018-11-23 VITALS — BP 130/80 | HR 83 | Ht 71.0 in | Wt 265.0 lb

## 2018-11-23 DIAGNOSIS — E1165 Type 2 diabetes mellitus with hyperglycemia: Secondary | ICD-10-CM | POA: Diagnosis not present

## 2018-11-23 DIAGNOSIS — E291 Testicular hypofunction: Secondary | ICD-10-CM | POA: Diagnosis not present

## 2018-11-23 MED ORDER — "SYRINGE/NEEDLE (DISP) 21G X 1-1/2"" 3 ML MISC": 0 refills | Status: AC

## 2018-11-23 MED ORDER — TESTOSTERONE CYPIONATE 200 MG/ML IM SOLN: 100.0000 mg | INTRAMUSCULAR | 0 refills | Status: DC

## 2018-11-23 NOTE — Progress Notes (Signed)
Endocrinology follow-up  Note   George Romero, 48 y.o., male   Chief Complaint  Patient presents with  . Follow-up    Hypogonadism     Past Medical History:  Diagnosis Date  . Diabetes mellitus without complication (HCC)   . Hypertension    History reviewed. No pertinent surgical history. Social History   Socioeconomic History  . Marital status: Married    Spouse name: Not on file  . Number of children: Not on file  . Years of education: Not on file  . Highest education level: Not on file  Occupational History  . Not on file  Social Needs  . Financial resource strain: Not on file  . Food insecurity:    Worry: Not on file    Inability: Not on file  . Transportation needs:    Medical: Not on file    Non-medical: Not on file  Tobacco Use  . Smoking status: Current Every Day Smoker    Packs/day: 0.50    Years: 15.00    Pack years: 7.50    Types: Cigarettes  . Smokeless tobacco: Never Used  Substance and Sexual Activity  . Alcohol use: No  . Drug use: No  . Sexual activity: Yes    Birth control/protection: None  Lifestyle  . Physical activity:    Days per week: Not on file    Minutes per session: Not on file  . Stress: Not on file  Relationships  . Social connections:    Talks on phone: Not on file    Gets together: Not on file    Attends religious service: Not on file    Active member of club or organization: Not on file    Attends meetings of clubs or organizations: Not on file    Relationship status: Not on file  Other Topics Concern  . Not on file  Social History Narrative  . Not on file   Outpatient Encounter Medications as of 11/23/2018  Medication Sig  . LANTUS 100 UNIT/ML injection Inject 40 Units into the skin at bedtime.  Marland Kitchen lisinopril (PRINIVIL,ZESTRIL) 10 MG tablet TK 1 T PO QD  . metFORMIN (GLUCOPHAGE) 1000 MG tablet TK 1 T PO  BID  . SYRINGE-NEEDLE, DISP, 3 ML 21G X 1-1/2" 3 ML MISC Use to  inject testosterone every week  . testosterone cypionate (DEPOTESTOSTERONE CYPIONATE) 200 MG/ML injection Inject 0.5 mLs (100 mg total) into the muscle every 7 (seven) days.   No facility-administered encounter medications on file as of 11/23/2018.    ALLERGIES: No Known Allergies  VACCINATION STATUS: Immunization History  Administered Date(s) Administered  . Tdap 11/02/2014    HPI: George Romero is a 48 y.o.-year-old man.  He is returning with repeat labs after he was seen in consultation for evaluation and management of low testosterone requested by by his  PMD  Avon Gully, MD.  -Patient with long and complicated medical history.  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 -He was previously treated with various forms of testosterone replacement including topical and injectable forms.  His last exposure to testosterone was more than 4 years ago with Dr. Johny Chess. -He admits to decreased libido, has difficulty obtaining and maintaining erection.  -He denies hx of  chemotherapy, and irradiation.  -He has never achieved  fertility. -He wishes to be treated with testosterone again.  No vision problems.  No report of changing headaches.  No family history of hypogonadism/infertility.  No family history of hemochromatosis or pituitary tumors.  ROS: Constitutional: + Progressive  Weight gain,  +  fatigue, no subjective hyperthermia/hypothermia Eyes: no blurry vision, no xerophthalmia ENT: no sore throat, no nodules palpated in throat, no dysphagia/odynophagia, no hoarseness Cardiovascular: no CP/SOB/palpitations/leg swelling Respiratory: no cough, no shortness of breath. Gastrointestinal: no nausea, vomiting, diarrhea, nor constipation Musculoskeletal: no muscle/joint aches Skin: no rashes Neurological: no  tremors/numbness/tingling/dizziness Psychiatric: no depression/anxiety  PE: BP 130/80   Pulse 83   Ht 5\' 11"  (1.803 m)   Wt 265 lb (120.2 kg)   BMI 36.96 kg/m  Wt Readings from Last 3 Encounters:  11/23/18 265 lb (120.2 kg)  11/15/18 268 lb (121.6 kg)  09/14/18 260 lb (117.9 kg)   Constitutional:  + obese, in NAD Eyes: PERRLA, EOMI, no exophthalmos ENT: moist mucous membranes, no thyromegaly, no cervical lymphadenopathy Cardiovascular: RRR, No MRG Respiratory: CTA B Gastrointestinal: abdomen soft, NT, ND, BS+ Musculoskeletal: no deformities, strength intact in all 4 Skin: moist, warm, no rashes Neurological: no tremor with outstretched hands, DTR normal in all 4 Genital exam: + Evidence of previous inguinal surgery, right testicle soft and less than 10 cc, 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  No other intrascrotal mass lesions.  No inguinal lymphadenopathy, normal phallus, no penile discharge.  + gynecomastia.  Results for George, Romero (MRN [id number]) as of 11/15/2018 10:36  Ref. Range 07/02/2018 14:14  LH Latest Ref Range: 1.7 - 8.6 mIU/mL 8.4  Prolactin Latest Ref Range: 4.0 - 15.2 ng/mL 3.4 (L)  Testosterone Latest Ref Range: 264 - 916 ng/dL 13 (L)  Prostate Specific Ag, Serum Latest Ref Range: 0.0 - 4.0 ng/mL <0.1    Recent Results (from the past 2160 hour(s))  CBC with Differential/Platelet     Status: Abnormal   Collection Time: 11/15/18 10:18 AM  Result Value Ref Range   WBC 7.8 3.8 - 10.8 Thousand/uL   RBC 4.61 4.20 - 5.80 Million/uL   Hemoglobin 12.6 (L) 13.2 - 17.1 g/dL   HCT 70.1 (L) 41.0 - 30.1 %   MCV 83.1 80.0 - 100.0 fL   MCH 27.3 27.0 - 33.0 pg   MCHC 32.9 32.0 - 36.0 g/dL   RDW 31.4 38.8 - 87.5 %   Platelets 308 140 - 400 Thousand/uL   MPV 11.0 7.5 - 12.5 fL   Neutro Abs 4,165 1,500 - 7,800 cells/uL   Lymphs Abs 2,824 850 - 3,900 cells/uL   Absolute Monocytes 382 200 - 950 cells/uL   Eosinophils Absolute 320 15 - 500 cells/uL   Basophils Absolute  109 0 - 200 cells/uL   Neutrophils Relative % 53.4 %   Total Lymphocyte 36.2 %   Monocytes Relative 4.9 %   Eosinophils Relative 4.1 %   Basophils Relative 1.4 %  Testos,Total,Free and SHBG (Male)     Status: Abnormal   Collection Time: 11/15/18 10:18 AM  Result Value Ref Range   Testosterone, Total, LC-MS-MS 14 (L) 250 - 1,100 ng/dL    Comment: . For additional information, please refer to http://education.questdiagnostics.com/faq/ TotalTestosteroneLCMSMSFAQ165 (This link is being provided for informational/ educational purposes only.) . This test was developed and its analytical performance characteristics have been determined by [facility] [place], [place]. It has not been cleared or approved by the U.S. Food and Drug Administration. This assay has been validated  pursuant to the CLIA regulations and is used for clinical purposes. .    Free Testosterone 1.8 (L) 35.0 - 155.0 pg/mL    Comment: . This test was developed and its analytical performance characteristics have been determined by [facility] [place], [place]. It has not been cleared or approved by the U.S. Food and Drug Administration. This assay has been validated pursuant to the CLIA regulations and is used for clinical purposes. .    Sex Hormone Binding 38 10 - 50 nmol/L     ASSESSMENT: 1. 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  Surgical treatment likely orchidopexy was attempted when he was 75 or 48 years old.  He only has soft and shrunk right testicle-less than 10 cc. -This patient will require and benefit from testosterone treatment. - I have discussed safe testosterone replacement strategies.  -I discussed and prescribed testosterone cypionate 100 mg IM every 7 days with plan to repeat total testosterone in 24-month.  -Patient is  unlikely to achieve any fertility, and is aware of the fact that testosterone replacement will not help him regain fertility.  -I had a long discussion with him about the need for regular follow-up with lab work at least every 3-55-month in order to achieve optimal testosterone replacement therapy.  -Given his history of type 2 diabetes, he will benefit the most from weight loss.  We briefly discussed about controlling his diet, improving his sleep, and exercise regularly.    -He is advised to continue Lantus 40 units nightly, metformin 1000 mg p.o. twice daily.  He is also advised to continue close monitoring twice a day-daily before breakfast and at bedtime.  - Patient admits there is a room for improvement in his diet and drink choices. -  Suggestion is made for him to avoid simple carbohydrates  from his diet including Cakes, Sweet Desserts / Pastries, Ice Cream, Soda (diet and regular), Sweet Tea, Candies, Chips, Cookies, Store Bought Juices, Alcohol in Excess of  1-2 drinks a day, Artificial Sweeteners, and "Sugar-free" Products. This will help patient to have stable blood glucose profile and potentially avoid unintended weight gain. -Consult with a dietitian is initiated.   - Time spent with the patient: 25 min, of which >50% was spent in reviewing his  current and  previous labs/studies, previous treatments, and medications doses and developing a plan for long-term care based on the latest recommendations for standards of care. George Romero participated in the discussions, expressed understanding, and voiced agreement with the above plans.  All questions were answered to his satisfaction. he is encouraged to contact clinic should he have any questions or concerns prior to his return visit.   Return in about 3 months (around 02/21/2019) for Follow up with Pre-visit Labs, Meter, and Logs.  Marquis Lunch, MD [facility] [address] [place], [place] [address] Phone: [phone]  Fax: [phone]   11/23/2018, 10:03 AM  This note was partially dictated with voice recognition software. Similar sounding words can be transcribed inadequately or may not  be corrected upon review.

## 2018-11-23 NOTE — Patient Instructions (Signed)
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
## 2019-02-23 ENCOUNTER — Ambulatory Visit: Payer: No Typology Code available for payment source | Admitting: "Endocrinology

## 2019-02-23 ENCOUNTER — Other Ambulatory Visit: Payer: Self-pay
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
## 2020-09-11 ENCOUNTER — Ambulatory Visit (INDEPENDENT_AMBULATORY_CARE_PROVIDER_SITE_OTHER): Payer: Self-pay | Admitting: Ophthalmology

## 2020-09-11 ENCOUNTER — Encounter (INDEPENDENT_AMBULATORY_CARE_PROVIDER_SITE_OTHER): Payer: Self-pay | Admitting: Ophthalmology

## 2020-09-11 ENCOUNTER — Other Ambulatory Visit: Payer: Self-pay

## 2020-09-11 DIAGNOSIS — H3581 Retinal edema: Secondary | ICD-10-CM

## 2020-09-11 DIAGNOSIS — H35033 Hypertensive retinopathy, bilateral: Secondary | ICD-10-CM

## 2020-09-11 DIAGNOSIS — I10 Essential (primary) hypertension: Secondary | ICD-10-CM

## 2020-09-11 DIAGNOSIS — H269 Unspecified cataract: Secondary | ICD-10-CM

## 2020-09-11 DIAGNOSIS — H40053 Ocular hypertension, bilateral: Secondary | ICD-10-CM

## 2020-09-11 DIAGNOSIS — E113513 Type 2 diabetes mellitus with proliferative diabetic retinopathy with macular edema, bilateral: Secondary | ICD-10-CM

## 2021-02-28 ENCOUNTER — Encounter (INDEPENDENT_AMBULATORY_CARE_PROVIDER_SITE_OTHER): Payer: Self-pay

## 2021-02-28 ENCOUNTER — Encounter (INDEPENDENT_AMBULATORY_CARE_PROVIDER_SITE_OTHER): Payer: No Typology Code available for payment source | Admitting: Ophthalmology

## 2021-06-11 LAB — TESTOSTERONE: Testosterone: 22

## 2021-06-11 LAB — PSA: PSA: 0.1

## 2021-06-26 ENCOUNTER — Encounter: Payer: Self-pay | Admitting: "Endocrinology

## 2021-06-26 ENCOUNTER — Ambulatory Visit (INDEPENDENT_AMBULATORY_CARE_PROVIDER_SITE_OTHER): Payer: No Typology Code available for payment source | Admitting: "Endocrinology

## 2021-06-26 VITALS — BP 126/78 | HR 68 | Ht 71.0 in | Wt 259.6 lb

## 2021-06-26 DIAGNOSIS — E291 Testicular hypofunction: Secondary | ICD-10-CM

## 2021-06-26 MED ORDER — TESTOSTERONE 20.25 MG/ACT (1.62%) TD GEL: TRANSDERMAL | 0 refills | Status: DC

## 2021-06-26 NOTE — Progress Notes (Signed)
Endocrinology follow-up  Note   George Romero, 50 y.o., male   Chief Complaint  Patient presents with   Follow-up    Hypogonadism, male     Past Medical History:  Diagnosis Date   Diabetes mellitus without complication (HCC)    Hypertension    Past Surgical History:  Procedure Laterality Date   RETINAL DETACHMENT SURGERY Left    Social History   Socioeconomic History   Marital status: Married    Spouse name: Not on file   Number of children: Not on file   Years of education: Not on file   Highest education level: Not on file  Occupational History   Not on file  Tobacco Use   Smoking status: Former    Packs/day: 0.50    Years: 15.00    Pack years: 7.50    Types: Cigarettes    Quit date: 2021    Years since quitting: 1.7   Smokeless tobacco: Never  Vaping Use   Vaping Use: Never used  Substance and Sexual Activity   Alcohol use: No   Drug use: No   Sexual activity: Yes    Birth control/protection: None  Other Topics Concern   Not on file  Social History Narrative   Not on file   Social Determinants of Health   Financial Resource Strain: Not on file  Food Insecurity: Not on file  Transportation Needs: Not on file  Physical Activity: Not on file  Stress: Not on file  Social Connections: Not on file   Outpatient Encounter Medications as of 06/26/2021  Medication Sig   Testosterone 20.25 MG/ACT (1.62%) GEL Apply 20.25mg /ACT on each shoulder every morning   LANTUS 100 UNIT/ML injection Inject 40 Units into the skin at bedtime.   lisinopril (PRINIVIL,ZESTRIL) 10 MG tablet TK 1 T PO QD   metFORMIN (GLUCOPHAGE) 1000 MG tablet TK 1 T PO  BID   SYRINGE-NEEDLE, DISP, 3 ML 21G X 1-1/2" 3 ML MISC Use to inject testosterone every week   [DISCONTINUED] testosterone cypionate (DEPOTESTOSTERONE CYPIONATE) 200 MG/ML injection Inject 0.5 mLs (100 mg total) into the muscle every 7 (seven) days. (Patient not taking:  Reported on 06/26/2021)   No facility-administered encounter medications on file as of 06/26/2021.   ALLERGIES: No Known Allergies  VACCINATION STATUS: Immunization History  Administered Date(s) Administered   Tdap 11/02/2014    HPI: [name] is a 50 y.o.-year-old man.  He was seen in February 2020 in consultation for hypogonadism, was started on IM testosterone, however patient did not return for follow-up.    Unfortunately, he did not continue on his testosterone prescription for more than 2 years.  He presents with previsit labs showing profoundly low testosterone of  22. His consult From Dr. Felecia Shelling. -Patient with long and complicated medical history.  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 -He was previously treated with various forms of testosterone replacement including topical and injectable forms.  -He still admits to decreased libido, has difficulty obtaining and maintaining erection.  -He denies hx of  chemotherapy, and irradiation.  -He has never achieved fertility. -He wishes to be treated with testosterone again.  No vision problems.  No report of changing headaches.  No family history of hypogonadism/infertility.  No family history of hemochromatosis or pituitary tumors.  ROS: Constitutional: + Progressive  Weight gain,  +  fatigue, no subjective hyperthermia/hypothermia   PE: BP 126/78   Pulse 68   Ht 5\' 11"  (1.803 m)   Wt 259 lb 9.6 oz (117.8 kg)   BMI 36.21 kg/m  Wt Readings from Last 3 Encounters:  06/26/21 259 lb 9.6 oz (117.8 kg)  11/23/18 265 lb (120.2 kg)  11/15/18 268 lb (121.6 kg)   Constitutional:  + obese, in NAD Eyes: PERRLA, EOMI, no exophthalmos ENT: moist mucous membranes, no thyromegaly, no cervical lymphadenopathy  Genital exam: + Evidence of previous inguinal surgery, right testicle  soft and less than 10 cc, 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  No other intrascrotal mass lesions.  No inguinal lymphadenopathy, normal phallus, no penile discharge.  + gynecomastia.  Results for George Romero (MRN George Romero) as of 11/15/2018 10:36  Ref. Range 07/02/2018 14:14  LH Latest Ref Range: 1.7 - 8.6 mIU/mL 8.4  Prolactin Latest Ref Range: 4.0 - 15.2 ng/mL 3.4 (L)  Testosterone Latest Ref Range: 264 - 916 ng/dL 13 (L)  Prostate Specific Ag, Serum Latest Ref Range: 0.0 - 4.0 ng/mL <0.1   Recent Results (from the past 2160 hour(s))  PSA     Status: None   Collection Time: 06/11/21 12:00 AM  Result Value Ref Range   PSA 0.1   Testosterone     Status: None   Collection Time: 06/11/21 12:00 AM  Result Value Ref Range   Testosterone 22       ASSESSMENT: 1. 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  Surgical treatment likely orchidopexy was attempted when he was 72 or 50 years old.  He only has soft and shrunk right testicle-less than 10 cc. -This patient will require and benefit from testosterone treatment. - I have discussed safe testosterone replacement strategies.  - he favors and wishes to get topical testosterone prep as opposed to IM. I discussed initiated testosterone gel 20.25 mg on each shoulder every morning with plan to repeat labs and office visit in 3 months.    -Patient is unlikely to achieve any fertility, and is aware of the fact that testosterone replacement will not help him regain fertility.  -I had a long discussion with him about the need for regular follow-up with lab work at least every 3-79-month in order to achieve optimal testosterone replacement therapy.  -Given his history of type 2 diabetes, he will benefit the most from weight loss.  We briefly discussed about controlling his diet, improving his sleep, and exercise regularly.     -He is advised to continue Lantus 40 units nightly, metformin 1000 mg p.o. twice daily.  He is also advised to continue close monitoring twice a day-daily before breakfast and at bedtime.  - he acknowledges that there is a room for improvement in his food and drink choices. - Suggestion is made for him to avoid simple carbohydrates  from his diet including Cakes, Sweet Desserts, Ice Cream, Soda (diet and regular), Sweet Tea, Candies, Chips, Cookies, Store Bought Juices, Alcohol in Excess of  1-2 drinks a day, Artificial Sweeteners,  Coffee Creamer, and "Sugar-free" Products, Lemonade. This will help patient to have more stable blood glucose profile and potentially avoid unintended weight gain.  He is advised to follow up closely with his PMD Dr. 6-month.   I spent 31 minutes in the care of the  patient today including review of labs from Thyroid Function, CMP, and other relevant labs ; imaging/biopsy records (current and previous including abstractions from other facilities); face-to-face time discussing  his lab results and symptoms, medications doses, his options of short and long term treatment based on the latest standards of care / guidelines;   and documenting the encounter.  George Romero  participated in the discussions, expressed understanding, and voiced agreement with the above plans.  All questions were answered to his satisfaction. he is encouraged to contact clinic should he have any questions or concerns prior to his return visit.   Return in about 3 months (around 09/25/2021) for Fasting Labs  in AM B4 8, F/U with Pre-visit Labs.  Marquis Lunch, MD [facility] [address] [place], [place] [address] Phone: [phone]  Fax: [phone]   06/26/2021, 2:25 PM  This note was partially dictated with voice recognition software. Similar sounding words can be transcribed inadequately or may not  be corrected upon review.

## 2021-06-27 ENCOUNTER — Telehealth: Payer: Self-pay

## 2021-06-27 NOTE — Telephone Encounter (Signed)
Started and sent PA for testosterone gel.

## 2021-06-27 NOTE — Telephone Encounter (Signed)
Received notification of prior authorization approval for testosterone. Prior auth reference [id number]

## 2021-06-28 NOTE — Telephone Encounter (Signed)
Patient made aware. He will call back to the pharmacy

## 2021-09-02 ENCOUNTER — Other Ambulatory Visit: Payer: Self-pay | Admitting: "Endocrinology

## 2021-10-08 ENCOUNTER — Ambulatory Visit: Payer: No Typology Code available for payment source | Admitting: "Endocrinology

## 2021-10-15 DIAGNOSIS — E113522 Type 2 diabetes mellitus with proliferative diabetic retinopathy with traction retinal detachment involving the macula, left eye: Secondary | ICD-10-CM | POA: Diagnosis not present

## 2021-10-15 DIAGNOSIS — Z794 Long term (current) use of insulin: Secondary | ICD-10-CM | POA: Diagnosis not present

## 2021-11-26 DIAGNOSIS — E113522 Type 2 diabetes mellitus with proliferative diabetic retinopathy with traction retinal detachment involving the macula, left eye: Secondary | ICD-10-CM | POA: Diagnosis not present

## 2021-11-26 DIAGNOSIS — E113511 Type 2 diabetes mellitus with proliferative diabetic retinopathy with macular edema, right eye: Secondary | ICD-10-CM | POA: Diagnosis not present

## 2021-11-26 DIAGNOSIS — Z794 Long term (current) use of insulin: Secondary | ICD-10-CM | POA: Diagnosis not present

## 2021-12-10 DIAGNOSIS — E113522 Type 2 diabetes mellitus with proliferative diabetic retinopathy with traction retinal detachment involving the macula, left eye: Secondary | ICD-10-CM | POA: Diagnosis not present

## 2021-12-10 DIAGNOSIS — E113511 Type 2 diabetes mellitus with proliferative diabetic retinopathy with macular edema, right eye: Secondary | ICD-10-CM | POA: Diagnosis not present

## 2021-12-10 DIAGNOSIS — Z794 Long term (current) use of insulin: Secondary | ICD-10-CM | POA: Diagnosis not present

## 2021-12-11 DIAGNOSIS — E113511 Type 2 diabetes mellitus with proliferative diabetic retinopathy with macular edema, right eye: Secondary | ICD-10-CM | POA: Diagnosis not present

## 2021-12-11 DIAGNOSIS — H25813 Combined forms of age-related cataract, bilateral: Secondary | ICD-10-CM | POA: Diagnosis not present

## 2021-12-11 DIAGNOSIS — E113522 Type 2 diabetes mellitus with proliferative diabetic retinopathy with traction retinal detachment involving the macula, left eye: Secondary | ICD-10-CM | POA: Diagnosis not present

## 2021-12-11 DIAGNOSIS — H21542 Posterior synechiae (iris), left eye: Secondary | ICD-10-CM | POA: Diagnosis not present

## 2021-12-12 DIAGNOSIS — E1165 Type 2 diabetes mellitus with hyperglycemia: Secondary | ICD-10-CM | POA: Diagnosis not present

## 2021-12-12 DIAGNOSIS — I10 Essential (primary) hypertension: Secondary | ICD-10-CM | POA: Diagnosis not present

## 2022-01-07 DIAGNOSIS — Z794 Long term (current) use of insulin: Secondary | ICD-10-CM | POA: Diagnosis not present

## 2022-01-07 DIAGNOSIS — E113511 Type 2 diabetes mellitus with proliferative diabetic retinopathy with macular edema, right eye: Secondary | ICD-10-CM | POA: Diagnosis not present

## 2022-01-07 DIAGNOSIS — E113522 Type 2 diabetes mellitus with proliferative diabetic retinopathy with traction retinal detachment involving the macula, left eye: Secondary | ICD-10-CM | POA: Diagnosis not present

## 2022-02-11 DIAGNOSIS — H2512 Age-related nuclear cataract, left eye: Secondary | ICD-10-CM | POA: Diagnosis not present

## 2022-02-11 DIAGNOSIS — Z794 Long term (current) use of insulin: Secondary | ICD-10-CM | POA: Diagnosis not present

## 2022-02-11 DIAGNOSIS — Z79899 Other long term (current) drug therapy: Secondary | ICD-10-CM | POA: Diagnosis not present

## 2022-02-11 DIAGNOSIS — Z87891 Personal history of nicotine dependence: Secondary | ICD-10-CM | POA: Diagnosis not present

## 2022-02-11 DIAGNOSIS — H2589 Other age-related cataract: Secondary | ICD-10-CM | POA: Diagnosis not present

## 2022-02-11 DIAGNOSIS — E1136 Type 2 diabetes mellitus with diabetic cataract: Secondary | ICD-10-CM | POA: Diagnosis not present

## 2022-02-11 DIAGNOSIS — I10 Essential (primary) hypertension: Secondary | ICD-10-CM | POA: Diagnosis not present

## 2022-02-11 DIAGNOSIS — Z7984 Long term (current) use of oral hypoglycemic drugs: Secondary | ICD-10-CM | POA: Diagnosis not present

## 2022-02-11 DIAGNOSIS — Z9889 Other specified postprocedural states: Secondary | ICD-10-CM | POA: Diagnosis not present

## 2022-02-11 DIAGNOSIS — H25812 Combined forms of age-related cataract, left eye: Secondary | ICD-10-CM | POA: Diagnosis not present

## 2022-02-12 DIAGNOSIS — H25811 Combined forms of age-related cataract, right eye: Secondary | ICD-10-CM | POA: Diagnosis not present

## 2022-02-18 DIAGNOSIS — Z794 Long term (current) use of insulin: Secondary | ICD-10-CM | POA: Diagnosis not present

## 2022-02-18 DIAGNOSIS — E113522 Type 2 diabetes mellitus with proliferative diabetic retinopathy with traction retinal detachment involving the macula, left eye: Secondary | ICD-10-CM | POA: Diagnosis not present

## 2022-03-11 DIAGNOSIS — I10 Essential (primary) hypertension: Secondary | ICD-10-CM | POA: Diagnosis not present

## 2022-03-11 DIAGNOSIS — H25811 Combined forms of age-related cataract, right eye: Secondary | ICD-10-CM | POA: Diagnosis not present

## 2022-03-11 DIAGNOSIS — Z7984 Long term (current) use of oral hypoglycemic drugs: Secondary | ICD-10-CM | POA: Diagnosis not present

## 2022-03-11 DIAGNOSIS — Z87891 Personal history of nicotine dependence: Secondary | ICD-10-CM | POA: Diagnosis not present

## 2022-03-11 DIAGNOSIS — Z79899 Other long term (current) drug therapy: Secondary | ICD-10-CM | POA: Diagnosis not present

## 2022-03-11 DIAGNOSIS — Z794 Long term (current) use of insulin: Secondary | ICD-10-CM | POA: Diagnosis not present

## 2022-03-11 DIAGNOSIS — E1136 Type 2 diabetes mellitus with diabetic cataract: Secondary | ICD-10-CM | POA: Diagnosis not present

## 2022-03-25 DIAGNOSIS — Z794 Long term (current) use of insulin: Secondary | ICD-10-CM | POA: Diagnosis not present

## 2022-03-25 DIAGNOSIS — E113531 Type 2 diabetes mellitus with proliferative diabetic retinopathy with traction retinal detachment not involving the macula, right eye: Secondary | ICD-10-CM | POA: Diagnosis not present

## 2022-03-25 DIAGNOSIS — E113522 Type 2 diabetes mellitus with proliferative diabetic retinopathy with traction retinal detachment involving the macula, left eye: Secondary | ICD-10-CM | POA: Diagnosis not present

## 2022-04-24 DIAGNOSIS — E113522 Type 2 diabetes mellitus with proliferative diabetic retinopathy with traction retinal detachment involving the macula, left eye: Secondary | ICD-10-CM | POA: Diagnosis not present

## 2022-04-24 DIAGNOSIS — Z794 Long term (current) use of insulin: Secondary | ICD-10-CM | POA: Diagnosis not present

## 2022-05-05 DIAGNOSIS — E119 Type 2 diabetes mellitus without complications: Secondary | ICD-10-CM | POA: Diagnosis not present

## 2022-05-05 DIAGNOSIS — I10 Essential (primary) hypertension: Secondary | ICD-10-CM | POA: Diagnosis not present

## 2022-05-05 DIAGNOSIS — H3341 Traction detachment of retina, right eye: Secondary | ICD-10-CM | POA: Diagnosis not present

## 2022-05-05 DIAGNOSIS — E669 Obesity, unspecified: Secondary | ICD-10-CM | POA: Diagnosis not present

## 2022-05-15 DIAGNOSIS — E113522 Type 2 diabetes mellitus with proliferative diabetic retinopathy with traction retinal detachment involving the macula, left eye: Secondary | ICD-10-CM | POA: Diagnosis not present

## 2022-05-15 DIAGNOSIS — Z794 Long term (current) use of insulin: Secondary | ICD-10-CM | POA: Diagnosis not present

## 2022-05-28 DIAGNOSIS — H26492 Other secondary cataract, left eye: Secondary | ICD-10-CM | POA: Diagnosis not present

## 2022-06-11 DIAGNOSIS — H26491 Other secondary cataract, right eye: Secondary | ICD-10-CM | POA: Diagnosis not present

## 2022-06-12 DIAGNOSIS — E113522 Type 2 diabetes mellitus with proliferative diabetic retinopathy with traction retinal detachment involving the macula, left eye: Secondary | ICD-10-CM | POA: Diagnosis not present

## 2022-06-12 DIAGNOSIS — Z794 Long term (current) use of insulin: Secondary | ICD-10-CM | POA: Diagnosis not present

## 2022-06-23 DIAGNOSIS — Z6837 Body mass index (BMI) 37.0-37.9, adult: Secondary | ICD-10-CM | POA: Diagnosis not present

## 2022-06-23 DIAGNOSIS — E668 Other obesity: Secondary | ICD-10-CM | POA: Diagnosis not present

## 2022-06-23 DIAGNOSIS — I10 Essential (primary) hypertension: Secondary | ICD-10-CM | POA: Diagnosis not present

## 2022-06-23 DIAGNOSIS — E113531 Type 2 diabetes mellitus with proliferative diabetic retinopathy with traction retinal detachment not involving the macula, right eye: Secondary | ICD-10-CM | POA: Diagnosis not present

## 2022-07-24 DIAGNOSIS — Z794 Long term (current) use of insulin: Secondary | ICD-10-CM | POA: Diagnosis not present

## 2022-07-24 DIAGNOSIS — E113522 Type 2 diabetes mellitus with proliferative diabetic retinopathy with traction retinal detachment involving the macula, left eye: Secondary | ICD-10-CM | POA: Diagnosis not present

## 2022-07-24 DIAGNOSIS — E113531 Type 2 diabetes mellitus with proliferative diabetic retinopathy with traction retinal detachment not involving the macula, right eye: Secondary | ICD-10-CM | POA: Diagnosis not present

## 2022-08-26 DIAGNOSIS — E113531 Type 2 diabetes mellitus with proliferative diabetic retinopathy with traction retinal detachment not involving the macula, right eye: Secondary | ICD-10-CM | POA: Diagnosis not present

## 2022-10-15 DIAGNOSIS — Z0001 Encounter for general adult medical examination with abnormal findings: Secondary | ICD-10-CM | POA: Diagnosis not present

## 2022-10-15 DIAGNOSIS — E1139 Type 2 diabetes mellitus with other diabetic ophthalmic complication: Secondary | ICD-10-CM | POA: Diagnosis not present

## 2022-10-15 DIAGNOSIS — E559 Vitamin D deficiency, unspecified: Secondary | ICD-10-CM | POA: Diagnosis not present

## 2022-10-15 DIAGNOSIS — E1169 Type 2 diabetes mellitus with other specified complication: Secondary | ICD-10-CM | POA: Diagnosis not present

## 2022-10-15 DIAGNOSIS — E1165 Type 2 diabetes mellitus with hyperglycemia: Secondary | ICD-10-CM | POA: Diagnosis not present

## 2022-10-15 DIAGNOSIS — E782 Mixed hyperlipidemia: Secondary | ICD-10-CM | POA: Diagnosis not present

## 2022-10-15 DIAGNOSIS — Z125 Encounter for screening for malignant neoplasm of prostate: Secondary | ICD-10-CM | POA: Diagnosis not present

## 2022-10-15 DIAGNOSIS — E291 Testicular hypofunction: Secondary | ICD-10-CM | POA: Diagnosis not present

## 2022-10-15 DIAGNOSIS — I10 Essential (primary) hypertension: Secondary | ICD-10-CM | POA: Diagnosis not present

## 2022-10-23 DIAGNOSIS — E113511 Type 2 diabetes mellitus with proliferative diabetic retinopathy with macular edema, right eye: Secondary | ICD-10-CM | POA: Diagnosis not present

## 2022-10-23 DIAGNOSIS — Z794 Long term (current) use of insulin: Secondary | ICD-10-CM | POA: Diagnosis not present

## 2022-10-24 DIAGNOSIS — E1169 Type 2 diabetes mellitus with other specified complication: Secondary | ICD-10-CM | POA: Diagnosis not present

## 2022-10-29 DIAGNOSIS — E1165 Type 2 diabetes mellitus with hyperglycemia: Secondary | ICD-10-CM | POA: Diagnosis not present

## 2022-10-29 DIAGNOSIS — I10 Essential (primary) hypertension: Secondary | ICD-10-CM | POA: Diagnosis not present

## 2022-11-20 DIAGNOSIS — Z794 Long term (current) use of insulin: Secondary | ICD-10-CM | POA: Diagnosis not present

## 2022-11-20 DIAGNOSIS — E113522 Type 2 diabetes mellitus with proliferative diabetic retinopathy with traction retinal detachment involving the macula, left eye: Secondary | ICD-10-CM | POA: Diagnosis not present

## 2022-11-20 DIAGNOSIS — E113511 Type 2 diabetes mellitus with proliferative diabetic retinopathy with macular edema, right eye: Secondary | ICD-10-CM | POA: Diagnosis not present

## 2022-12-18 DIAGNOSIS — E113522 Type 2 diabetes mellitus with proliferative diabetic retinopathy with traction retinal detachment involving the macula, left eye: Secondary | ICD-10-CM | POA: Diagnosis not present

## 2022-12-18 DIAGNOSIS — Z794 Long term (current) use of insulin: Secondary | ICD-10-CM | POA: Diagnosis not present

## 2022-12-18 DIAGNOSIS — E113511 Type 2 diabetes mellitus with proliferative diabetic retinopathy with macular edema, right eye: Secondary | ICD-10-CM | POA: Diagnosis not present

## 2023-01-20 DIAGNOSIS — E113511 Type 2 diabetes mellitus with proliferative diabetic retinopathy with macular edema, right eye: Secondary | ICD-10-CM | POA: Diagnosis not present

## 2023-01-20 DIAGNOSIS — Z794 Long term (current) use of insulin: Secondary | ICD-10-CM | POA: Diagnosis not present

## 2023-01-28 DIAGNOSIS — N182 Chronic kidney disease, stage 2 (mild): Secondary | ICD-10-CM | POA: Diagnosis not present

## 2023-01-28 DIAGNOSIS — E1122 Type 2 diabetes mellitus with diabetic chronic kidney disease: Secondary | ICD-10-CM | POA: Diagnosis not present

## 2023-01-28 DIAGNOSIS — H547 Unspecified visual loss: Secondary | ICD-10-CM | POA: Diagnosis not present

## 2023-01-28 DIAGNOSIS — Z809 Family history of malignant neoplasm, unspecified: Secondary | ICD-10-CM | POA: Diagnosis not present

## 2023-01-28 DIAGNOSIS — I129 Hypertensive chronic kidney disease with stage 1 through stage 4 chronic kidney disease, or unspecified chronic kidney disease: Secondary | ICD-10-CM | POA: Diagnosis not present

## 2023-01-28 DIAGNOSIS — Z8249 Family history of ischemic heart disease and other diseases of the circulatory system: Secondary | ICD-10-CM | POA: Diagnosis not present

## 2023-01-28 DIAGNOSIS — H409 Unspecified glaucoma: Secondary | ICD-10-CM | POA: Diagnosis not present

## 2023-01-28 DIAGNOSIS — Z87891 Personal history of nicotine dependence: Secondary | ICD-10-CM | POA: Diagnosis not present

## 2023-01-28 DIAGNOSIS — Z833 Family history of diabetes mellitus: Secondary | ICD-10-CM | POA: Diagnosis not present

## 2023-01-28 DIAGNOSIS — E785 Hyperlipidemia, unspecified: Secondary | ICD-10-CM | POA: Diagnosis not present

## 2023-01-28 DIAGNOSIS — Z794 Long term (current) use of insulin: Secondary | ICD-10-CM | POA: Diagnosis not present

## 2023-02-02 DIAGNOSIS — I10 Essential (primary) hypertension: Secondary | ICD-10-CM | POA: Diagnosis not present

## 2023-02-02 DIAGNOSIS — E1165 Type 2 diabetes mellitus with hyperglycemia: Secondary | ICD-10-CM | POA: Diagnosis not present

## 2023-02-02 DIAGNOSIS — E1139 Type 2 diabetes mellitus with other diabetic ophthalmic complication: Secondary | ICD-10-CM | POA: Diagnosis not present

## 2023-02-03 DIAGNOSIS — I10 Essential (primary) hypertension: Secondary | ICD-10-CM | POA: Diagnosis not present

## 2023-02-03 DIAGNOSIS — E1165 Type 2 diabetes mellitus with hyperglycemia: Secondary | ICD-10-CM | POA: Diagnosis not present

## 2023-02-19 DIAGNOSIS — E113511 Type 2 diabetes mellitus with proliferative diabetic retinopathy with macular edema, right eye: Secondary | ICD-10-CM | POA: Diagnosis not present

## 2023-02-19 DIAGNOSIS — Z794 Long term (current) use of insulin: Secondary | ICD-10-CM | POA: Diagnosis not present

## 2023-02-19 DIAGNOSIS — E113522 Type 2 diabetes mellitus with proliferative diabetic retinopathy with traction retinal detachment involving the macula, left eye: Secondary | ICD-10-CM | POA: Diagnosis not present

## 2023-03-31 DIAGNOSIS — Z7984 Long term (current) use of oral hypoglycemic drugs: Secondary | ICD-10-CM | POA: Diagnosis not present

## 2023-03-31 DIAGNOSIS — Z794 Long term (current) use of insulin: Secondary | ICD-10-CM | POA: Diagnosis not present

## 2023-03-31 DIAGNOSIS — E113522 Type 2 diabetes mellitus with proliferative diabetic retinopathy with traction retinal detachment involving the macula, left eye: Secondary | ICD-10-CM | POA: Diagnosis not present

## 2023-03-31 DIAGNOSIS — E113511 Type 2 diabetes mellitus with proliferative diabetic retinopathy with macular edema, right eye: Secondary | ICD-10-CM | POA: Diagnosis not present

## 2023-05-14 DIAGNOSIS — Z794 Long term (current) use of insulin: Secondary | ICD-10-CM | POA: Diagnosis not present

## 2023-05-14 DIAGNOSIS — E113511 Type 2 diabetes mellitus with proliferative diabetic retinopathy with macular edema, right eye: Secondary | ICD-10-CM | POA: Diagnosis not present

## 2023-05-18 DIAGNOSIS — I10 Essential (primary) hypertension: Secondary | ICD-10-CM | POA: Diagnosis not present

## 2023-05-18 DIAGNOSIS — E1165 Type 2 diabetes mellitus with hyperglycemia: Secondary | ICD-10-CM | POA: Diagnosis not present

## 2023-05-18 DIAGNOSIS — E1139 Type 2 diabetes mellitus with other diabetic ophthalmic complication: Secondary | ICD-10-CM | POA: Diagnosis not present

## 2023-05-20 DIAGNOSIS — E1165 Type 2 diabetes mellitus with hyperglycemia: Secondary | ICD-10-CM | POA: Diagnosis not present

## 2023-05-20 DIAGNOSIS — E1169 Type 2 diabetes mellitus with other specified complication: Secondary | ICD-10-CM | POA: Diagnosis not present

## 2023-05-20 DIAGNOSIS — Z0001 Encounter for general adult medical examination with abnormal findings: Secondary | ICD-10-CM | POA: Diagnosis not present

## 2023-05-20 DIAGNOSIS — E1139 Type 2 diabetes mellitus with other diabetic ophthalmic complication: Secondary | ICD-10-CM | POA: Diagnosis not present

## 2023-06-17 DIAGNOSIS — H26493 Other secondary cataract, bilateral: Secondary | ICD-10-CM | POA: Diagnosis not present

## 2023-06-23 DIAGNOSIS — Z794 Long term (current) use of insulin: Secondary | ICD-10-CM | POA: Diagnosis not present

## 2023-06-23 DIAGNOSIS — E113511 Type 2 diabetes mellitus with proliferative diabetic retinopathy with macular edema, right eye: Secondary | ICD-10-CM | POA: Diagnosis not present

## 2023-08-04 DIAGNOSIS — Z794 Long term (current) use of insulin: Secondary | ICD-10-CM | POA: Diagnosis not present

## 2023-08-04 DIAGNOSIS — E113531 Type 2 diabetes mellitus with proliferative diabetic retinopathy with traction retinal detachment not involving the macula, right eye: Secondary | ICD-10-CM | POA: Diagnosis not present

## 2023-08-04 DIAGNOSIS — E113522 Type 2 diabetes mellitus with proliferative diabetic retinopathy with traction retinal detachment involving the macula, left eye: Secondary | ICD-10-CM | POA: Diagnosis not present

## 2023-08-05 DIAGNOSIS — N399 Disorder of urinary system, unspecified: Secondary | ICD-10-CM | POA: Diagnosis not present

## 2023-08-25 DIAGNOSIS — E782 Mixed hyperlipidemia: Secondary | ICD-10-CM | POA: Diagnosis not present

## 2023-08-25 DIAGNOSIS — E1169 Type 2 diabetes mellitus with other specified complication: Secondary | ICD-10-CM | POA: Diagnosis not present

## 2023-08-25 DIAGNOSIS — E1139 Type 2 diabetes mellitus with other diabetic ophthalmic complication: Secondary | ICD-10-CM | POA: Diagnosis not present

## 2023-08-25 DIAGNOSIS — E1165 Type 2 diabetes mellitus with hyperglycemia: Secondary | ICD-10-CM | POA: Diagnosis not present

## 2023-08-25 DIAGNOSIS — I10 Essential (primary) hypertension: Secondary | ICD-10-CM | POA: Diagnosis not present

## 2023-09-08 DIAGNOSIS — E113531 Type 2 diabetes mellitus with proliferative diabetic retinopathy with traction retinal detachment not involving the macula, right eye: Secondary | ICD-10-CM | POA: Diagnosis not present

## 2023-09-08 DIAGNOSIS — Z7984 Long term (current) use of oral hypoglycemic drugs: Secondary | ICD-10-CM | POA: Diagnosis not present

## 2023-09-08 DIAGNOSIS — Z794 Long term (current) use of insulin: Secondary | ICD-10-CM | POA: Diagnosis not present

## 2023-12-22 ENCOUNTER — Encounter: Payer: Self-pay | Admitting: *Deleted

## 2024-04-11 ENCOUNTER — Other Ambulatory Visit (HOSPITAL_COMMUNITY): Payer: Self-pay | Admitting: Nephrology

## 2024-04-11 DIAGNOSIS — E1122 Type 2 diabetes mellitus with diabetic chronic kidney disease: Secondary | ICD-10-CM

## 2024-04-11 DIAGNOSIS — N1831 Chronic kidney disease, stage 3a: Secondary | ICD-10-CM

## 2024-04-11 DIAGNOSIS — D638 Anemia in other chronic diseases classified elsewhere: Secondary | ICD-10-CM

## 2024-04-11 DIAGNOSIS — R809 Proteinuria, unspecified: Secondary | ICD-10-CM

## 2024-04-18 ENCOUNTER — Ambulatory Visit (HOSPITAL_COMMUNITY)
Admission: RE | Admit: 2024-04-18 | Discharge: 2024-04-18 | Disposition: A | Discharge status: Home / Self Care | Source: Ambulatory Visit | Attending: Nephrology | Admitting: Nephrology

## 2024-04-18 DIAGNOSIS — N1831 Chronic kidney disease, stage 3a: Secondary | ICD-10-CM | POA: Insufficient documentation

## 2024-04-18 DIAGNOSIS — D638 Anemia in other chronic diseases classified elsewhere: Secondary | ICD-10-CM | POA: Insufficient documentation

## 2024-04-18 DIAGNOSIS — E1122 Type 2 diabetes mellitus with diabetic chronic kidney disease: Secondary | ICD-10-CM | POA: Insufficient documentation

## 2024-04-18 DIAGNOSIS — R809 Proteinuria, unspecified: Secondary | ICD-10-CM | POA: Diagnosis present

## 2024-06-20 ENCOUNTER — Encounter: Payer: Self-pay | Admitting: Emergency Medicine

## 2024-06-20 ENCOUNTER — Other Ambulatory Visit: Payer: Self-pay

## 2024-06-20 ENCOUNTER — Ambulatory Visit: Admission: EM | Admit: 2024-06-20 | Discharge: 2024-06-20 | Disposition: A | Discharge status: Home / Self Care

## 2024-06-20 DIAGNOSIS — R059 Cough, unspecified: Secondary | ICD-10-CM | POA: Diagnosis not present

## 2024-06-20 DIAGNOSIS — H9212 Otorrhea, left ear: Secondary | ICD-10-CM | POA: Diagnosis not present

## 2024-06-20 DIAGNOSIS — J22 Unspecified acute lower respiratory infection: Secondary | ICD-10-CM

## 2024-06-20 LAB — POC SOFIA SARS ANTIGEN FIA: SARS Coronavirus 2 Ag: NEGATIVE

## 2024-06-20 MED ORDER — PROMETHAZINE-DM 6.25-15 MG/5ML PO SYRP: 5.0000 mL | ORAL_SOLUTION | Freq: Four times a day (QID) | ORAL | 0 refills | Status: AC | PRN

## 2024-06-20 MED ORDER — AMOXICILLIN-POT CLAVULANATE 875-125 MG PO TABS: 1.0000 | ORAL_TABLET | Freq: Two times a day (BID) | ORAL | 0 refills | Status: AC

## 2024-06-20 MED ORDER — FLUTICASONE PROPIONATE 50 MCG/ACT NA SUSP: 2.0000 | Freq: Every day | NASAL | 0 refills | Status: AC

## 2024-06-20 NOTE — ED Provider Notes (Signed)
 [facility]    CSN: [id number] Arrival date & time: 06/20/24  1041      History   Chief Complaint Chief Complaint  Patient presents with   Cough    HPI George Romero is a 53 y.o. male.   The history is provided by the patient and the spouse.   Patient presents with family for complaints of cough, headache, nasal congestion, fever, and left ear drainage.  Patient states symptoms started approximately 3 to 4 days ago.  Tmax between 101-102.  Denies chills, body aches, difficulty breathing, chest pain, abdominal pain, nausea, vomiting, diarrhea, or rash.  So far, states he has been taking ibuprofen, Aleve, and Coricidin for his symptoms.  States symptoms started after he went to the eye doctor last week.  Past Medical History:  Diagnosis Date   Diabetes mellitus without complication (HCC)    Hypertension     Patient Active Problem List   Diagnosis Date Noted   Uncontrolled type 2 diabetes mellitus with hyperglycemia (HCC) 11/23/2018   Hypogonadism, male 11/15/2018    Past Surgical History:  Procedure Laterality Date   EYE SURGERY     RETINAL DETACHMENT SURGERY Left        Home Medications    Prior to Admission medications   Medication Sig Start Date End Date Taking? Authorizing Provider  amLODipine (NORVASC) 5 MG tablet Take 5 mg by mouth. 03/23/24  Yes [provider]  dorzolamide-timolol (COSOPT) 2-0.5 % ophthalmic solution Apply 1 drop to eye. 05/25/24  Yes [provider]  glucose blood (FREESTYLE LITE) test strip 1 strip. 09/25/21  Yes [provider]  OZEMPIC, 0.25 OR 0.5 MG/DOSE, 2 MG/3ML SOPN start with 0.25 MG SUBCUTANEOUSLY EACH WEEK x4wks, THEN increase TO 0.5mg  SUBCUTANEOUSLY EACH wk, THEN MAY increase TO 1mg  THEN 2mg .] 05/23/24  Yes [provider]  triamcinolone (KENALOG) 0.025 % cream Apply topically 2 (two) times daily. 02/15/24  Yes [provider]  LANTUS 100 UNIT/ML injection Inject 40 Units into  the skin at bedtime. 05/18/18   [provider]  lisinopril (PRINIVIL,ZESTRIL) 10 MG tablet TK 1 T PO QD 05/18/18   [provider]  metFORMIN (GLUCOPHAGE) 1000 MG tablet TK 1 T PO  BID 05/18/18   [provider]  SYRINGE-NEEDLE, DISP, 3 ML 21G X 1-1/2 3 ML MISC Use to inject testosterone  every week 11/23/18   Nida, Gebreselassie W, MD  Testosterone  1.62 % GEL APPLY 1 PUMP (20.25 MG) TOPICALLY ON EACH SHOULDER EVERY MORNING 09/02/21   Lenis Ethelle ORN, MD    Family History Family History  Problem Relation Age of Onset   Cancer Mother    Diabetes Mother     Social History Social History   Tobacco Use   Smoking status: Former    Current packs/day: 0.00    Average packs/day: 0.5 packs/day for 15.0 years (7.5 ttl pk-yrs)    Types: Cigarettes    Start date: 2006    Quit date: 2021    Years since quitting: 4.7   Smokeless tobacco: Never  Vaping Use   Vaping status: Never Used  Substance Use Topics   Alcohol use: No   Drug use: No     Allergies   Patient has no known allergies.   Review of Systems Review of Systems Per HPI  Physical Exam Triage Vital Signs ED Triage Vitals  Encounter Vitals Group     BP 06/20/24 1137 129/84     Girls Systolic BP Percentile --  Girls Diastolic BP Percentile --      Boys Systolic BP Percentile --      Boys Diastolic BP Percentile --      Pulse Rate 06/20/24 1137 (!) 103     Resp 06/20/24 1137 20     Temp 06/20/24 1137 98.8 F (37.1 C)     Temp Source 06/20/24 1137 Oral     SpO2 06/20/24 1137 97 %     Weight --      Height --      Head Circumference --      Peak Flow --      Pain Score 06/20/24 1134 0     Pain Loc --      Pain Education --      Exclude from Growth Chart --    No data found.  Updated Vital Signs BP 129/84 (BP Location: Right Arm)   Pulse (!) 103   Temp 98.8 F (37.1 C) (Oral)   Resp 20   SpO2 97%   Visual Acuity Right Eye Distance:   Left Eye Distance:   Bilateral  Distance:    Right Eye Near:   Left Eye Near:    Bilateral Near:     Physical Exam Vitals and nursing note reviewed.  Constitutional:      General: He is not in acute distress.    Appearance: Normal appearance.  HENT:     Head: Normocephalic.     Right Ear: Tympanic membrane, ear canal and external ear normal.     Left Ear: Tympanic membrane, ear canal and external ear normal. Drainage (clear) present. Tympanic membrane is not erythematous.     Nose: Congestion present.     Right Turbinates: Enlarged and swollen.     Left Turbinates: Enlarged and swollen.     Right Sinus: No maxillary sinus tenderness or frontal sinus tenderness.     Left Sinus: No maxillary sinus tenderness or frontal sinus tenderness.     Mouth/Throat:     Lips: Pink.     Mouth: Mucous membranes are moist.     Pharynx: Postnasal drip present. No pharyngeal swelling, oropharyngeal exudate, posterior oropharyngeal erythema or uvula swelling.  Eyes:     Extraocular Movements: Extraocular movements intact.     Conjunctiva/sclera: Conjunctivae normal.     Pupils: Pupils are equal, round, and reactive to light.  Cardiovascular:     Rate and Rhythm: Normal rate and regular rhythm.     Pulses: Normal pulses.     Heart sounds: Normal heart sounds.  Pulmonary:     Effort: Pulmonary effort is normal. No respiratory distress.     Breath sounds: Normal breath sounds. No stridor. No wheezing, rhonchi or rales.  Abdominal:     General: Bowel sounds are normal.     Palpations: Abdomen is soft.     Tenderness: There is no abdominal tenderness.  Musculoskeletal:     Cervical back: Normal range of motion.  Skin:    General: Skin is warm and dry.  Neurological:     General: No focal deficit present.     Mental Status: He is alert and oriented to person, place, and time.  Psychiatric:        Mood and Affect: Mood normal.        Behavior: Behavior normal.      UC Treatments / Results  Labs (all labs ordered are  listed, but only abnormal results are displayed) Labs Reviewed  POC SOFIA SARS ANTIGEN FIA  EKG   Radiology No results found.  Procedures Procedures (including critical care time)  Medications Ordered in UC Medications - No data to display  Initial Impression / Assessment and Plan / UC Course  I have reviewed the triage vital signs and the nursing notes.  Pertinent labs & imaging results that were available during my care of the patient were reviewed by me and considered in my medical decision making (see chart for details).  COVID test was negative.  On exam, t the patient does have rhonchi noted throughout that clears with cough.  Room air sats at 97%.  He also has clear drainage from the left ear.  Because patient has had fever and rhonchi with a productive cough, will cover for lower respiratory infection with Augmentin  875/125 mg.  Symptomatic treatment provided with Promethazine  DM.  The Augmentin  will also cover for any possible infection of the left ear.  Fluticasone  50 mcg nasal spray prescribed for nasal congestion and runny nose.  Supportive care recommendations were provided and discussed with patient and his spouse to include fluids, rest, over-the-counter analgesics, and use of a humidifier during sleep.  Discussed indications with patient and spouse regarding follow-up.  Patient and spouse were in agreement with this plan of care and verbalized understanding.  All questions were answered.  Patient stable for discharge.  Final Clinical Impressions(s) / UC Diagnoses   Final diagnoses:  None   Discharge Instructions   None    ED Prescriptions   None    PDMP not reviewed this encounter.   Gilmer Etta PARAS, NP 06/20/24 [phone]

## 2024-06-20 NOTE — Discharge Instructions (Addendum)
 The COVID test was negative. Take medication as prescribed. Increase fluids and allow for plenty of rest. You may continue over-the-counter Tylenol  as needed for pain, fever, or general discomfort. Recommend use of normal saline nasal spray throughout the day for nasal congestion and runny nose. For your cough, recommend use of a humidifier in your bedroom at nighttime during sleep and sleeping elevated on pillows while cough symptoms persist. As discussed, complete your medications prescribed today.  If symptoms fail to improve with this treatment, or begin to worsen, you may follow-up in this clinic or with your primary care physician for further evaluation. Follow-up as needed.

## 2024-06-20 NOTE — ED Triage Notes (Signed)
 Pt reports cough,intermittent fever, sore throat, left ear drainage x4 days. Pt reports has had otc medication with minimal change in symptoms.
